# Patient Record
Sex: Female | Born: 1935 | Race: White | Hispanic: No | Smoking: Current every day smoker
Health system: Southern US, Community
[De-identification: ages and names within clinical notes are randomized; demographics above are authoritative.]

## PROBLEM LIST (undated history)

## (undated) DIAGNOSIS — D72829 Elevated white blood cell count, unspecified: Secondary | ICD-10-CM

## (undated) DIAGNOSIS — E785 Hyperlipidemia, unspecified: Secondary | ICD-10-CM

## (undated) DIAGNOSIS — J449 Chronic obstructive pulmonary disease, unspecified: Secondary | ICD-10-CM

## (undated) DIAGNOSIS — M1990 Unspecified osteoarthritis, unspecified site: Secondary | ICD-10-CM

## (undated) DIAGNOSIS — Z8679 Personal history of other diseases of the circulatory system: Secondary | ICD-10-CM

## (undated) DIAGNOSIS — Z720 Tobacco use: Secondary | ICD-10-CM

## (undated) HISTORY — DX: Chronic obstructive pulmonary disease, unspecified: J44.9

## (undated) HISTORY — DX: Tobacco use: Z72.0

## (undated) HISTORY — DX: Unspecified osteoarthritis, unspecified site: M19.90

## (undated) HISTORY — DX: Elevated white blood cell count, unspecified: D72.829

## (undated) HISTORY — PX: OTHER SURGICAL HISTORY: SHX169

## (undated) HISTORY — DX: Personal history of other diseases of the circulatory system: Z86.79

## (undated) HISTORY — DX: Hyperlipidemia, unspecified: E78.5

---

## 1998-04-15 ENCOUNTER — Other Ambulatory Visit: Admission: RE | Admit: 1998-04-15 | Discharge: 1998-04-15 | Payer: Self-pay | Admitting: Cardiology

## 1999-04-18 ENCOUNTER — Other Ambulatory Visit: Admission: RE | Admit: 1999-04-18 | Discharge: 1999-04-18 | Payer: Self-pay | Admitting: Cardiology

## 1999-12-08 ENCOUNTER — Other Ambulatory Visit: Admission: RE | Admit: 1999-12-08 | Discharge: 1999-12-08 | Payer: Self-pay | Admitting: Orthopedic Surgery

## 2000-06-16 ENCOUNTER — Other Ambulatory Visit: Admission: RE | Admit: 2000-06-16 | Discharge: 2000-06-16 | Payer: Self-pay | Admitting: Internal Medicine

## 2001-12-02 ENCOUNTER — Encounter: Admission: RE | Admit: 2001-12-02 | Discharge: 2001-12-02 | Payer: Self-pay | Admitting: Urology

## 2001-12-02 ENCOUNTER — Encounter: Payer: Self-pay | Admitting: Urology

## 2006-09-24 ENCOUNTER — Ambulatory Visit (HOSPITAL_COMMUNITY): Admission: RE | Admit: 2006-09-24 | Discharge: 2006-09-24 | Payer: Self-pay | Admitting: Internal Medicine

## 2006-11-15 ENCOUNTER — Inpatient Hospital Stay (HOSPITAL_COMMUNITY): Admission: EM | Admit: 2006-11-15 | Discharge: 2006-11-20 | Payer: Self-pay | Admitting: Emergency Medicine

## 2006-11-15 ENCOUNTER — Ambulatory Visit: Payer: Self-pay | Admitting: Cardiovascular Disease

## 2006-11-15 ENCOUNTER — Ambulatory Visit: Payer: Self-pay | Admitting: Internal Medicine

## 2006-11-17 ENCOUNTER — Encounter: Payer: Self-pay | Admitting: Cardiovascular Disease

## 2006-12-07 ENCOUNTER — Ambulatory Visit: Payer: Self-pay | Admitting: Internal Medicine

## 2007-01-19 ENCOUNTER — Ambulatory Visit: Payer: Self-pay | Admitting: Internal Medicine

## 2007-06-28 ENCOUNTER — Encounter (HOSPITAL_COMMUNITY): Admission: RE | Admit: 2007-06-28 | Discharge: 2007-09-26 | Payer: Self-pay | Admitting: Internal Medicine

## 2007-09-22 ENCOUNTER — Encounter: Payer: Self-pay | Admitting: Internal Medicine

## 2007-09-22 DIAGNOSIS — E785 Hyperlipidemia, unspecified: Secondary | ICD-10-CM | POA: Insufficient documentation

## 2007-09-22 DIAGNOSIS — J309 Allergic rhinitis, unspecified: Secondary | ICD-10-CM | POA: Insufficient documentation

## 2007-09-22 DIAGNOSIS — M1990 Unspecified osteoarthritis, unspecified site: Secondary | ICD-10-CM | POA: Insufficient documentation

## 2007-09-22 DIAGNOSIS — I2510 Atherosclerotic heart disease of native coronary artery without angina pectoris: Secondary | ICD-10-CM | POA: Insufficient documentation

## 2007-09-22 DIAGNOSIS — M810 Age-related osteoporosis without current pathological fracture: Secondary | ICD-10-CM | POA: Insufficient documentation

## 2007-09-22 DIAGNOSIS — J449 Chronic obstructive pulmonary disease, unspecified: Secondary | ICD-10-CM

## 2007-09-22 DIAGNOSIS — D72829 Elevated white blood cell count, unspecified: Secondary | ICD-10-CM | POA: Insufficient documentation

## 2007-09-29 ENCOUNTER — Ambulatory Visit: Payer: Self-pay | Admitting: Internal Medicine

## 2007-10-17 DIAGNOSIS — F17200 Nicotine dependence, unspecified, uncomplicated: Secondary | ICD-10-CM

## 2007-10-28 ENCOUNTER — Ambulatory Visit: Payer: Self-pay | Admitting: Internal Medicine

## 2007-10-28 ENCOUNTER — Encounter: Payer: Self-pay | Admitting: Internal Medicine

## 2008-12-09 ENCOUNTER — Encounter: Payer: Self-pay | Admitting: Internal Medicine

## 2009-09-16 ENCOUNTER — Encounter: Payer: Self-pay | Admitting: Internal Medicine

## 2009-10-14 ENCOUNTER — Encounter: Payer: Self-pay | Admitting: Internal Medicine

## 2009-10-31 ENCOUNTER — Encounter: Payer: Self-pay | Admitting: Internal Medicine

## 2009-11-25 ENCOUNTER — Encounter: Payer: Self-pay | Admitting: Internal Medicine

## 2009-11-27 ENCOUNTER — Encounter: Payer: Self-pay | Admitting: Internal Medicine

## 2009-11-28 ENCOUNTER — Telehealth: Payer: Self-pay | Admitting: Internal Medicine

## 2009-11-28 DIAGNOSIS — R634 Abnormal weight loss: Secondary | ICD-10-CM | POA: Insufficient documentation

## 2009-11-28 DIAGNOSIS — Z860100 Personal history of colon polyps, unspecified: Secondary | ICD-10-CM | POA: Insufficient documentation

## 2009-11-28 DIAGNOSIS — R110 Nausea: Secondary | ICD-10-CM

## 2009-12-20 ENCOUNTER — Telehealth: Payer: Self-pay | Admitting: Internal Medicine

## 2009-12-31 ENCOUNTER — Ambulatory Visit: Payer: Self-pay | Admitting: Internal Medicine

## 2010-01-01 ENCOUNTER — Telehealth: Payer: Self-pay | Admitting: Internal Medicine

## 2010-01-10 ENCOUNTER — Telehealth: Payer: Self-pay | Admitting: Internal Medicine

## 2010-01-24 ENCOUNTER — Ambulatory Visit: Payer: Self-pay | Admitting: Internal Medicine

## 2010-01-27 LAB — CONVERTED CEMR LAB
ALT: 21 units/L (ref 0–35)
AST: 26 units/L (ref 0–37)
Albumin: 3.9 g/dL (ref 3.5–5.2)
Alkaline Phosphatase: 60 units/L (ref 39–117)

## 2010-01-30 ENCOUNTER — Ambulatory Visit: Payer: Self-pay | Admitting: Internal Medicine

## 2010-01-31 ENCOUNTER — Ambulatory Visit (HOSPITAL_COMMUNITY): Admission: RE | Admit: 2010-01-31 | Discharge: 2010-01-31 | Payer: Self-pay | Admitting: Internal Medicine

## 2010-01-31 LAB — CONVERTED CEMR LAB: Prealbumin: 23.7 mg/dL (ref 18.0–45.0)

## 2010-02-04 ENCOUNTER — Ambulatory Visit: Payer: Self-pay | Admitting: Internal Medicine

## 2010-02-08 ENCOUNTER — Encounter: Payer: Self-pay | Admitting: Internal Medicine

## 2010-02-10 ENCOUNTER — Ambulatory Visit: Payer: Self-pay | Admitting: Internal Medicine

## 2010-02-10 ENCOUNTER — Encounter: Payer: Self-pay | Admitting: Internal Medicine

## 2010-02-10 DIAGNOSIS — J9610 Chronic respiratory failure, unspecified whether with hypoxia or hypercapnia: Secondary | ICD-10-CM

## 2010-05-05 ENCOUNTER — Ambulatory Visit: Payer: Self-pay | Admitting: Internal Medicine

## 2010-06-24 ENCOUNTER — Encounter: Payer: Self-pay | Admitting: Cardiovascular Disease

## 2010-07-23 ENCOUNTER — Ambulatory Visit: Payer: Self-pay | Admitting: Internal Medicine

## 2010-11-11 ENCOUNTER — Encounter: Payer: Self-pay | Admitting: Internal Medicine

## 2010-11-11 ENCOUNTER — Ambulatory Visit
Admission: RE | Admit: 2010-11-11 | Discharge: 2010-11-11 | Payer: Self-pay | Source: Home / Self Care | Attending: Internal Medicine | Admitting: Internal Medicine

## 2010-11-18 NOTE — Letter (Signed)
Summary: [facility]   Imported By: Lennie Odor 11/28/2009 17:39:24  _____________________________________________________________________  External Attachment:    Type:   Image     Comment:   External Document

## 2010-11-18 NOTE — Assessment & Plan Note (Signed)
Summary: Pulmonary consultation/ copd eval     Primary Provider/Referring Provider:  Dr. Waynard Edwards  CC:  Followup per Dr Waynard Edwards.  Pt last seen April 2008.  She states that her breathing has been worsening over the past several months.  She gets SOB with any exertion.  She also c/o dry cough.  She has lost her appetite.  Marland Kitchen  History of Present Illness: 89 yowf still smoking but 02 dep copd x FEV1 1.33 (72) with ratio of 48 and DLC0 54% No f/u in pulmonary clinic since 2008.  December 31, 2009 cc indolent onset 08/2009  progressive worsening  dry mouth and loss of appetite with 30 lb wt loss and now on 02 24 hour per day and only able room to room , mild dry congested cough.  Pt denies any significant sore throat, dysphagia, itching, sneezing,  nasal congestion or excess secretions,  fever, chills, sweats, pleuritic or exertional cp, hempoptysis, change in activity tolerance  orthopnea pnd or leg swelling.  Pt also denies any obvious fluctuation in symptoms with weather or environmental change or other alleviating or aggravating factors except better transiently some  better with prednisone.      Current Medications (verified): 1)  Crestor 10 Mg  Tabs (Rosuvastatin Calcium) .... Once Daily 2)  Advair Diskus 250-50 Mcg/dose  Misc (Fluticasone-Salmeterol) .... Two Times A Day 3)  Adult Aspirin Low Strength 81 Mg  Tbdp (Aspirin) .... Once Daily 4)  Vitamin D 2000 Iu .... Once Daily 5)  Calcium With Vit D 600mg  .... Two Times A Day 6)  Flonase 50 Mcg/act  Susp (Fluticasone Propionate) .... As Needed 7)  Spiriva Handihaler 18 Mcg Caps (Tiotropium Bromide Monohydrate) .... Inhale Contents of 1 Capsule Daily 8)  Lexapro (? Strength) .... 1/2 Once Daily 9)  Proair Hfa 108 (90 Base) Mcg/act Aers (Albuterol Sulfate) .... 2 Puffs Every 4-6 Hours As Needed  Allergies (verified): No Known Drug Allergies  Past History:  Past Medical History: TOBACCO ABUSE (ICD-305.1) ATHEROSCLEROTIC CARDIOVASCULAR DISEASE  (ICD-429.2) LEUKOCYTOSIS (ICD-288.60) OSTEOARTHRITIS (ICD-715.90) ALLERGIC RHINITIS (ICD-477.9) HYPERLIPIDEMIA (ICD-272.4) OSTEOPOROSIS (ICD-733.00) COPD (ICD-496)      - PFT's 01/19/07  FEV1 1.33 (72) with ratio of 48 and DLC0 54%      - HFA 50% December 31, 2009   Review of Systems  The patient denies shortness of breath at rest, productive cough, coughing up blood, chest pain, irregular heartbeats, acid heartburn, indigestion, abdominal pain, difficulty swallowing, sore throat, tooth/dental problems, headaches, nasal congestion/difficulty breathing through nose, sneezing, itching, ear ache, anxiety, depression, hand/feet swelling, joint stiffness or pain, rash, change in color of mucus, and fever.    Vital Signs:  Patient profile:   77 year old female Height:      62 inches Weight:      96.25 pounds BMI:     17.67 O2 Sat:      97 % on 4 L/min Temp:     98.3 degrees F oral Pulse rate:   123 / minute  Vitals Entered By: Vernie Murders (December 31, 2009 2:30 PM)  O2 Flow:  4 L/min  Physical Exam  Additional Exam:  wt 127 > 96 December 31, 2009  HEENT mild turbinate edema.  Oropharynx no thrush or excess pnd or cobblestoning.  No JVD or cervical adenopathy. Mild accessory muscle hypertrophy. Trachea midline, nl thryroid. Chest was hyperinflated by percussion with diminished breath sounds and marked increased exp time without wheeze. Hoover sign positive onset of inspiration. Regular rate and rhythm without  murmur gallop or rub or increase P2.  No edema.  Decrease s1s2 Abd: no hsm, nl excursion. Ext warm without cyanosis or clubbing.    Impression & Recommendations:  Problem # 1:  COPD (ICD-496) Previously only GOLD II so unlikely she's progressed that much over the past 3 years and steroid response encouraging if only in the short run.    DDX of  difficult airways managment all start with A and  include Adherence, Ace Inhibitors, Acid Reflux, Active Sinus Disease, Alpha 1 Antitripsin  deficiency, Anxiety masquerading as Airways dz,  ABPA,  allergy(esp in young), Aspiration (esp in elderly), Adverse effects of DPI,  Active smokers, plus one B  = Beta blocker use..   Adherence and Adverse effect of DPI as well as Active smoking all major concerns here.  For now will try change to duoneb until returns for PFT's.   Each maintenance medication was reviewed in detail including most importantly the difference between maintenance and as needed and under what circumstances the prns are to be used. See instructions for specific recommendations   Problem # 2:  TOBACCO ABUSE (ICD-305.1)  Discussed but not ready to committ to quit at this point - emphasized risks involved in continuing smoking and that patient should consider these in the context of the cost of smoking relative to the benefit obtained.   I took this opportunity to educate the patient regarding the consequences of smoking in airway disorders based on all the data we have from the multiple national lung health studies indicating that smoking cessation, not choice of inhalers or physicians, is the most important aspect of care.    Medications Added to Medication List This Visit: 1)  Spiriva Handihaler 18 Mcg Caps (Tiotropium bromide monohydrate) .... Inhale contents of 1 capsule daily 2)  Lexapro (? Strength)  .... 1/2 once daily 3)  Proair Hfa 108 (90 Base) Mcg/act Aers (Albuterol sulfate) .... 2 puffs every 4-6 hours as needed 4)  Prednisone 10 Mg Tabs (Prednisone) .... 4 each am x 2days, 2x2days, 1x2days and stop 5)  Duoneb 0.5-2.5 (3) Mg/64ml Soln (Ipratropium-albuterol) .... One four times a day  Other Orders: New Patient Level V ([address])  Patient Instructions: 1)  Prednisone 4 each am x 2days, 2x2days, 1x2days and stop  2)  Duoneb up to 4 x times a day and use proaire as needed  3)  Stop spiriva and advair 4)  Prilosec 20mg  Take  one 30-60 min before first meal of the day and pepcid 20 mg one at bedtime  5)  GERD  (REFLUX)  is a common cause of respiratory symptoms. It commonly presents without heartburn and can be treated with medication, but also with lifestyle changes including avoidance of late meals, excessive alcohol, smoking cessation, and avoid fatty foods, chocolate, peppermint, colas, red wine, and acidic juices such as orange juice. NO MINT OR MENTHOL PRODUCTS SO NO COUGH DROPS  6)  USE SUGARLESS CANDY INSTEAD (jolley ranchers)  7)  NO OIL BASED VITAMINS  8)  Please schedule a follow-up appointment in 6 weeks, sooner if needed with PFT's on return Prescriptions: DUONEB 0.5-2.5 (3) MG/3ML SOLN (IPRATROPIUM-ALBUTEROL) one four times a day  #120 x 11   Entered and Authorized by:   Nyoka Cowden MD   Signed by:   Nyoka Cowden MD on 12/31/2009   Method used:   Electronically to        [facility] (retail)       [phone]  [address]       [place], [place]  [address]       Ph: [phone]       Fax: [phone]   RxID:   [id number] PREDNISONE 10 MG  TABS (PREDNISONE) 4 each am x 2days, 2x2days, 1x2days and stop  #14 x 0   Entered and Authorized by:   Nyoka Cowden MD   Signed by:   Nyoka Cowden MD on 12/31/2009   Method used:   Electronically to        [facility] ([facility])       [address]       [place], [place]  [address]       Ph: [phone]       Fax: [phone]   RxID:   [id number]

## 2010-11-18 NOTE — Assessment & Plan Note (Signed)
Summary: Pulmonary/ f/u ov teach hfa 90% > try symbicort 160   Copy to:  n/a Primary Provider/Referring Provider:  Redge Gainer. Perini, MD   CC:  3 month followup.  Pt states that her breathing is the same- no better or worse.  She c/o nasal congestion and "tickle in throat" x 2 months.  She has occ cough with clear sputum.Marland Kitchen  History of Present Illness: 77  yowf still smoking but noct 02 dep copd with FEV1 .95 ( 54%) ratio 41 February 10, 2010   December 31, 2009 cc indolent onset 08/2009  progressive worsening  dry mouth and loss of appetite with 30 lb wt loss and now on 02 24 hour per day and only able room to room , mild dry congested cough.  rec Prednisone 4 each am x 2days, 2x2days, 1x2days and stop  Duoneb up to 4 x times a day and use proaire as needed  Stop spiriva and advair Prilosec 20mg  Take  one 30-60 min before first meal of the day and pepcid 20 mg one at bedtime   February 10, 2010 6 wk followup with PFT' c/w GOLD II copd.  Pt states that her breathing has improved a little since last seen.  She is also coughing less and now cough is prod with clear sputum.   Not taking prilosec before meals but has found doesn't need the neb but twice daily at this point. rec stop smoking, no change rx  May 05, 2010 3 month followup- breathing is the same- no better or worse.  No new complaints today.  Still smoking. no increase need for rescue, no change doe. rec stop smoking continue ppi ac daily and h2 hs.  July 23, 2010 3 month followup.  Pt states that her breathing is the same- no better or worse.  She c/o nasal congestion and "tickle in throat" x 2 months.  She has occ cough with clear sputum. Pt denies any significant sore throat, dysphagia, itching, sneezing,  nasal congestion or excess secretions,  fever, chills, sweats, unintended wt loss, pleuritic or exertional cp, hempoptysis, change in activity tolerance  orthopnea pnd or leg swelling.  Pt also denies any obvious fluctuation in symptoms with  weather or environmental change or other alleviating or aggravating factors.         Preventive Screening-Counseling & Management  Alcohol-Tobacco     Smoking Status: current     Smoking Cessation Counseling: yes  Current Medications (verified): 1)  Crestor 10 Mg  Tabs (Rosuvastatin Calcium) .... Once Daily 2)  Adult Aspirin Low Strength 81 Mg  Tbdp (Aspirin) .... Once Daily 3)  Vitamin D 1000 Unit Tabs (Cholecalciferol) .Marland Kitchen.. 1 Once Daily 4)  Calcium With Vit D 600mg  .... Two Times A Day 5)  Flonase 50 Mcg/act  Susp (Fluticasone Propionate) .... 2 Spray Each Nostril Once Daily 6)  Lexapro 10 Mg Tabs (Escitalopram Oxalate) .... 1/2 Tablet Daily 7)  Align  Caps (Probiotic Product) .... One Capsule By Mouth Once Daily 8)  Omeprazole 20 Mg Cpdr (Omeprazole) .... One Tablet By Mouth Once Daily 9)  Saline Nasal Spray 0.65 % Soln (Saline) .Marland Kitchen.. 1 To 2 Sprays Once Daily As Needed 10)  Duoneb 0.5-2.5 (3) Mg/69ml Soln (Ipratropium-Albuterol) .... One in Nebulizer Up To 4 Times Per Day As Needed 11)  Proair Hfa 108 (90 Base) Mcg/act Aers (Albuterol Sulfate) .... 2 Puffs Every 4-6 Hours As Needed 12)  * 02 2lpm At Bedtime and As Needed Otherwise  Allergies (  verified): No Known Drug Allergies  Past History:  Past Medical History: TOBACCO ABUSE (ICD-305.1) ATHEROSCLEROTIC CARDIOVASCULAR DISEASE (ICD-429.2) LEUKOCYTOSIS (ICD-288.60) OSTEOARTHRITIS (ICD-715.90) ALLERGIC RHINITIS (ICD-477.9) HYPERLIPIDEMIA (ICD-272.4) OSTEOPOROSIS (ICD-733.00) COPD (ICD-496)      - PFT's 01/19/07  FEV1 1.33 (72) with ratio of 48 and DLC0 54%      - PFT/s 2/25/11FEV1 .95(75) ratio 41with DLC0 53%      - HFA 50% December 31, 2009 > 75% February 10, 2010 > 75% May 05, 2010 > 90% July 23, 2010   Vital Signs:  Patient profile:   77 year old female Weight:      101 pounds O2 Sat:      93 % on Room air Temp:     97.8 degrees F oral Pulse rate:   79 / minute BP sitting:   148 / 82  (left arm)  Vitals Entered By:  Vernie Murders (July 23, 2010 3:18 PM)  O2 Flow:  Room air  Physical Exam  Additional Exam:  wt  96 December 31, 2009 > 95 February 10, 2010 > 98 May 05, 2010 > 101 July 23, 2010  HEENT mild turbinate edema.  Oropharynx no thrush or excess pnd or cobblestoning.  No JVD or cervical adenopathy. Mild accessory muscle hypertrophy. Trachea midline, nl thryroid. Chest was hyperinflated by percussion with diminished breath sounds and marked increased exp time without wheeze. Hoover sign positive onset of inspiration. Regular rate and rhythm without murmur gallop or rub or increase P2.  No edema.  Decrease s1s2 Abd: no hsm, nl excursion. Ext warm without cyanosis or clubbing.    Impression & Recommendations:  Problem # 1:  COPD (ICD-496) GOLD II/II  DDX of  difficult airways managment all start with A and  include Adherence, Ace Inhibitors, Acid Reflux, Active Sinus Disease, Alpha 1 Antitripsin deficiency, Anxiety masquerading as Airways dz,  ABPA,  allergy(esp in young), Aspiration (esp in elderly), Adverse effects of DPI,  Active smokers, plus one B  = Beta blocker use.Marland Kitchen   Active smoking biggest issue, see #2  Adherence:  I spent extra time with the patient today explaining optimal mdi  technique.  This improved from  75 to 90% with coaching so try symbicort 160 2 puffs first thing  in am and 2 puffs again in pm about 12 hours later and see if this improves breathing and reduces need for proaire  Problem # 2:  TOBACCO ABUSE (ICD-305.1)   I emphasized that although we never turn away smokers from the pulmonary clinic, we do ask that they understand that the recommendations that were made won't work nearly as well in the presence of continued cigarette exposure and we may reach a point where we can't help the patient if he/she can't quit smoking.    Orders: Est. Patient Level IV ([address])  Medications Added to Medication List This Visit: 1)  Vitamin D 1000 Unit Tabs (Cholecalciferol) .Marland Kitchen.. 1 once  daily 2)  Flonase 50 Mcg/act Susp (Fluticasone propionate) .... 2 spray each nostril once daily 3)  Flonase 50 Mcg/act Susp (Fluticasone propionate) .Marland Kitchen.. 1-2 sprays every 12 hours 4)  Omeprazole 20 Mg Cpdr (Omeprazole) .... Take  one 30-60 min before first meal of the day 5)  Pepcid 20 Mg Tabs (Famotidine) .... Take one by mouth at bedtime 6)  Symbicort 160-4.5 Mcg/act Aero (Budesonide-formoterol fumarate) .... 2 puffs first thing  in am and 2 puffs again in pm about 12 hours later  Patient Instructions: 1)  Flonase  no immediate benefit in terms of improving symptoms.  To help them reached the target tissue, the patient should use Afrin two puffs every 12 hours applied one min before using the nasal steroids.  Afrin should be stopped after no more than 5 days.  If the symptoms worsen, Afrin can be restarted after 5 days off of therapy to prevent rebound congestion from overuse of Afrin.  I also emphasized that in no way are nasal steroids a concern in terms of "addiction". 2)  Trial symbicort 160 2 puffs first thing  in am and 2 puffs again in pm about 12 hours later and it improves your breathing and reduces your need for proaiare - symbicort works in 15 mins so after this time ok to supplement the symbicort with duoneb or the proaire 3)  Return to office in 3 months, sooner if needed  Prescriptions: SYMBICORT 160-4.5 MCG/ACT  AERO (BUDESONIDE-FORMOTEROL FUMARATE) 2 puffs first thing  in am and 2 puffs again in pm about 12 hours later  #1 x 11   Entered and Authorized by:   Nyoka Cowden MD   Signed by:   Nyoka Cowden MD on 07/23/2010   Method used:   Print then Give to Patient   RxID:   [id number]

## 2010-11-18 NOTE — Assessment & Plan Note (Signed)
Summary: weight loss/abdmonial pain--ch.    History of Present Illness Visit Type: Follow-up Visit Primary GI MD: Lina Sar MD Primary Provider: Redge Gainer. Perini, MD  Requesting Provider: n/a Chief Complaint: weight loss and loss of appetite  History of Present Illness:   77 year old white female with  severe O2 dependent  COPD who continues to smoke. She has  weight loss of 30 pounds,  from 125 pounds 2 years ago to currently 92 pounds. She complains of dry mouth, decreased appetite. and indigestion. She is on  24-hour a day 2l  nasal oxygen.. Patient denies nausea, vomiting or diarrhea. Her bowel habits have been regular. Last colonoscopy in January 2009 showed  2 hyperplastic polyps. She has  tried  Megace but stopped taking it because she didn't feel it helped her to gain weight.   GI Review of Systems    Reports loss of appetite and  weight loss.   Weight loss of 30 pounds over 8 months .   Denies abdominal pain, acid reflux, belching, bloating, chest pain, dysphagia with liquids, dysphagia with solids, heartburn, nausea, vomiting, vomiting blood, and  weight gain.        Denies anal fissure, black tarry stools, change in bowel habit, constipation, diarrhea, diverticulosis, fecal incontinence, heme positive stool, hemorrhoids, irritable bowel syndrome, jaundice, light color stool, liver problems, rectal bleeding, and  rectal pain.    Current Medications (verified): 1)  Crestor 10 Mg  Tabs (Rosuvastatin Calcium) .... Once Daily 2)  Adult Aspirin Low Strength 81 Mg  Tbdp (Aspirin) .... Once Daily 3)  Vitamin D 2000 Iu .... Once Daily 4)  Calcium With Vit D 600mg  .... Two Times A Day 5)  Flonase 50 Mcg/act  Susp (Fluticasone Propionate) .... As Needed 6)  Proair Hfa 108 (90 Base) Mcg/act Aers (Albuterol Sulfate) .... 2 Puffs Every 4-6 Hours As Needed 7)  Duoneb 0.5-2.5 (3) Mg/28ml Soln (Ipratropium-Albuterol) .... One Four Times A Day 8)  Lexapro 10 Mg Tabs (Escitalopram Oxalate) ....  1/2 Tablet Daily 9)  Align  Caps (Probiotic Product) .... One Capsule By Mouth Once Daily 10)  Pepcid Ac Maximum Strength 20 Mg Tabs (Famotidine) .... One Tablet By Mouth At Bedtime  Allergies (verified): No Known Drug Allergies  Past History:  Past Medical History: Reviewed history from 12/31/2009 and no changes required. TOBACCO ABUSE (ICD-305.1) ATHEROSCLEROTIC CARDIOVASCULAR DISEASE (ICD-429.2) LEUKOCYTOSIS (ICD-288.60) OSTEOARTHRITIS (ICD-715.90) ALLERGIC RHINITIS (ICD-477.9) HYPERLIPIDEMIA (ICD-272.4) OSTEOPOROSIS (ICD-733.00) COPD (ICD-496)      - PFT's 01/19/07  FEV1 1.33 (72) with ratio of 48 and DLC0 54%      - HFA 50% December 31, 2009   Past Surgical History: Reviewed history from 09/22/2007 and no changes required. Angiogram  Family History: Reviewed history from 11/28/2009 and no changes required. Family History of Heart Disease: Father No FH of Colon Cancer:  Social History: Occupation: Retired Patient currently smokes.  Alcohol Use - yes: 1/2- 2 glasses of wine daily  Daily Caffeine Use: one cup of tea or coffee  Illicit Drug Use - no Patient does not get regular exercise.  Does Patient Exercise:  no  Review of Systems       The patient complains of fatigue.  The patient denies allergy/sinus, anemia, anxiety-new, arthritis/joint pain, back pain, blood in urine, breast changes/lumps, change in vision, confusion, cough, coughing up blood, depression-new, fainting, fever, headaches-new, hearing problems, heart murmur, heart rhythm changes, itching, menstrual pain, muscle pains/cramps, night sweats, nosebleeds, pregnancy symptoms, shortness of breath, skin rash, sleeping problems,  sore throat, swelling of feet/legs, swollen lymph glands, thirst - excessive , urination - excessive , urination changes/pain, urine leakage, vision changes, and voice change.         Pertinent positive and negative review of systems were noted in the above HPI. All other ROS was otherwise  negative.   Vital Signs:  Patient profile:   77 year old female Height:      62 inches Weight:      92 pounds BMI:     16.89 BSA:     1.38 Pulse rate:   100 / minute Pulse rhythm:   regular BP sitting:   136 / 84  (left arm) Cuff size:   regular  Vitals Entered By: Ok Anis CMA (January 24, 2010 3:04 PM)  Physical Exam  General:  Gen. chronically ill appearing female alert and oriented with the nasal oxygen cannula. She's able to ambulate independently Eyes:  PERRLA, no icterus. Mouth:  normal oral mucosa Chest Wall:  intercostal wasting Lungs:  decreased breath sounds Heart:  rapid S1-S2 Abdomen:  scaphoid soft abdomen with normoactive bowel sounds. No distention. No tenderness no bruit, no palpable mass or stool, Rectal:  soft Hemoccult negative stool Extremities:  No clubbing, cyanosis, edema or deformities noted. Psych:  Alert and cooperative. Normal mood and affect.   Impression & Recommendations:  Problem # 1:  WEIGHT LOSS, ABNORMAL (ICD-783.21) abnormal weight loss in patient with severe COPD and decreased appetite continued smoking. She turns off her oxygen when she smokes.R/o  occult malignancy, rule out peptic ulcer disease. We will schedule upper abdominal ultrasound to visualize the pancreas and upper endoscopy to rule out peptic ulcer disease or gastric outlet obstruction. She will restart  Prilosec 20 mg daily. We will check serum  prealbumin to assess her nutritional status. She will restart Megace 200 mg daily. We have discussed possibly  low dose  the prednisone to  stimulate her appetite. She has started on Lexapro 10 mg daily just 2 weeks ago and it may help. I feel she most likely has a COPD related cachexia. Also urged her to stop smoking Orders: TLB-Albumin (82040-ALB) TLB-Hepatic/Liver Function Pnl (80076-HEPATIC) EGD (EGD) Ultrasound Abdomen (UAS)  Problem # 2:  COLONIC POLYPS, HYPERPLASTIC, HX OF (ICD-V12.72) last colonoscopy January 2009 showed  hyperplastic polyp. Recall colonoscopy 7 years if she is in a condition to have it.  Problem # 3:  TOBACCO ABUSE (ICD-305.1) continues to smoke, takes her nasal O2 off when she smokes., example of poor discipline.  Patient Instructions: 1)  You have been scheduled for a abdmominal ultrasound. 2)  Upper Endoscopy brochure given.  3)  Prilosec 20 mg p.o. q.a.m. 4)  Megace 5 cc p.o. q.d. 5)  Consider low-dose steroids to improve appetite 6)  Serum prealbumin 7)  Copy sent to :DR Rodrigo Ran  Prescriptions: OMEPRAZOLE 20 MG CPDR (OMEPRAZOLE) one tablet by mouth once daily  #30 x 11   Entered by:   Christie Nottingham CMA (AAMA)   Authorized by:   Hart Carwin MD   Signed by:   Hart Carwin MD on 01/25/2010   Method used:   Electronically to        [facility] (retail)       [address]       [place], [place]  [address]       Ph: [phone]       Fax: [phone]  RxID:   [id number] MEGACE ORAL 40 MG/ML SUSP (MEGESTROL ACETATE) one teaspoon (10 oz)  once daily  #220mg  x 2   Entered by:   Christie Nottingham CMA (AAMA)   Authorized by:   Hart Carwin MD   Signed by:   Hart Carwin MD on 01/25/2010   Method used:   Electronically to        [facility] (retail)       [address]       [place], [place]  [address]       Ph: [phone]       Fax: [phone]   RxID:   [id number]

## 2010-11-18 NOTE — Letter (Signed)
Summary: EGD Instructions  [facility]  [address] [place], [place] [address]   Phone: [phone]  Fax: [phone]       NGOC DETJEN    September 23, 1931    MRN: [id number]       Procedure Day /Date: Tuesday April 19th, 2011     Arrival Time:  3:00pm     Procedure Time: 4:00pm     Location of Procedure:                    _  x_ [facility] (4th Floor)    PREPARATION FOR ENDOSCOPY   On  02/04/10 THE DAY OF THE PROCEDURE:  1.   No solid foods, milk or milk products are allowed after midnight the night before your procedure.  2.   Do not drink anything colored red or purple.  Avoid juices with pulp.  No orange juice.  3.  You may drink clear liquids until 2:00pm , which is 2 hours before your procedure.                                                                                                CLEAR LIQUIDS INCLUDE: Water Jello Ice Popsicles Tea (sugar ok, no milk/cream) Powdered fruit flavored drinks Coffee (sugar ok, no milk/cream) Gatorade Juice: apple, white grape, white cranberry  Lemonade Clear bullion, consomm, broth Carbonated beverages (any kind) Strained chicken noodle soup Hard Candy   MEDICATION INSTRUCTIONS  Unless otherwise instructed, you should take regular prescription medications with a small sip of water as early as possible the morning of your procedure.          OTHER INSTRUCTIONS  You will need a responsible adult at least 76 years of age to accompany you and drive you home.   This person must remain in the waiting room during your procedure.  Wear loose fitting clothing that is easily removed.  Leave jewelry and other valuables at home.  However, you may wish to bring a book to read or an iPod/MP3 player to listen to music as you wait for your procedure to start.  Remove all body piercing jewelry and leave at home.  Total time from sign-in until discharge is approximately 2-3 hours.  You should go  home directly after your procedure and rest.  You can resume normal activities the day after your procedure.  The day of your procedure you should not:   Drive   Make legal decisions   Operate machinery   Drink alcohol   Return to work  You will receive specific instructions about eating, activities and medications before you leave.    The above instructions have been reviewed and explained to me by   Marchelle Folks.     I fully understand and can verbalize these instructions _____________________________ Date _________

## 2010-11-18 NOTE — Letter (Signed)
Summary: [facility]  [facility]   Imported By: Lennie Odor 11/28/2009 17:37:40  _____________________________________________________________________  External Attachment:    Type:   Image     Comment:   External Document

## 2010-11-18 NOTE — Letter (Signed)
Summary: Patient Notice-Endo Biopsy Results  [facility]  [address] [place], [place] [address]   Phone: [phone]  Fax: [phone]        February 08, 2010 MRN: [id number]    DERITA MICHELSEN [address] [place], [place]  [address]    Dear Ms. Laural Benes,  I am pleased to inform you that the biopsies taken during your recent endoscopic examination did not show any evidence of cancer upon pathologic examination.  Additional information/recommendations:  __No further action is needed at this time.  Please follow-up with      your primary care physician for your other healthcare needs.  __ Please call [phone] to schedule a return visit to review      your condition.  _x_ Continue with the treatment plan as outlined on the day of your      exam.     Please call us if you are having persistent problems or have questions about your condition that have not been fully answered at this time.  Sincerely,  Hart Carwin MD  This letter has been electronically signed by your physician.  Appended Document: Patient Notice-Endo Biopsy Results letter mailed 4.25.11

## 2010-11-18 NOTE — Progress Notes (Signed)
Summary: fyi  Phone Note Call from Patient   Caller: Patient Call For: Lerone Onder Summary of Call: pt needed to give meds that she forgot to let dr know she was taking which is: aprolextro 5mg  once daily and metestrol ac Initial call taken by: Rickard Patience,  January 01, 2010 10:30 AM  Follow-up for Phone Call        spoke with pt and she states at OV she could not remember strength of lexapro. Sh eis taking lexapro 10mg  1/2 tab daily. I have changed this on the med list. The pt wasnot sure again what the second med was, so I advisd when she figured it out to call with name. Carron Curie CMA  January 01, 2010 11:10 AM     New/Updated Medications: LEXAPRO 10 MG TABS (ESCITALOPRAM OXALATE) 1/2 tablet daily

## 2010-11-18 NOTE — Assessment & Plan Note (Signed)
Summary: Pulmonary/ ext f/u ov with hfa at 50%   Copy to:  n/a Primary Provider/Referring Provider:  Redge Gainer. Perini, MD   CC:  3 month followup- breathing is the same- no better or worse.  No new complaints today.  Still smoking.Marland Kitchen  History of Present Illness: 38  yowf still smoking but noct 02 dep copd with FEV1 .95 ( 54%) ratio 41 February 10, 2010   December 31, 2009 cc indolent onset 08/2009  progressive worsening  dry mouth and loss of appetite with 30 lb wt loss and now on 02 24 hour per day and only able room to room , mild dry congested cough.  rec Prednisone 4 each am x 2days, 2x2days, 1x2days and stop  Duoneb up to 4 x times a day and use proaire as needed  Stop spiriva and advair Prilosec 20mg  Take  one 30-60 min before first meal of the day and pepcid 20 mg one at bedtime   February 10, 2010 6 wk followup with PFT's.  Pt states that her breathing has improved a little since last seen.  She is also coughing less and now cough is prod with clear sputum.   Not taking prilosec before meals but has found doesn't need the neb but twice daily at this point. rec stop smoking, no change rx  May 05, 2010 3 month followup- breathing is the same- no better or worse.  No new complaints today.  Still smoking. no increase need for rescue, no change doe. Pt denies any significant sore throat, dysphagia, itching, sneezing,  nasal congestion or excess secretions,  fever, chills, sweats, unintended wt loss, pleuritic or exertional cp, hempoptysis, change in activity tolerance  orthopnea pnd or leg swelling.  Pt also denies any obvious fluctuation in symptoms with weather or environmental change or other alleviating or aggravating factors.    Pt denies any increase in rescue therapy over baseline, denies waking up needing it or having early am exacerbations of coughing/wheezing/ or dyspnea      Current Medications (verified): 1)  Crestor 10 Mg  Tabs (Rosuvastatin Calcium) .... Once Daily 2)  Adult Aspirin  Low Strength 81 Mg  Tbdp (Aspirin) .... Once Daily 3)  Vitamin D 2000 Iu .... Once Daily 4)  Calcium With Vit D 600mg  .... Two Times A Day 5)  Flonase 50 Mcg/act  Susp (Fluticasone Propionate) .... As Needed 6)  Lexapro 10 Mg Tabs (Escitalopram Oxalate) .... 1/2 Tablet Daily 7)  Align  Caps (Probiotic Product) .... One Capsule By Mouth Once Daily 8)  Omeprazole 20 Mg Cpdr (Omeprazole) .... One Tablet By Mouth Once Daily 9)  Saline Nasal Spray 0.65 % Soln (Saline) .Marland Kitchen.. 1 To 2 Sprays Once Daily As Needed 10)  Duoneb 0.5-2.5 (3) Mg/97ml Soln (Ipratropium-Albuterol) .... One in Nebulizer Up To 4 Times Per Day As Needed 11)  Proair Hfa 108 (90 Base) Mcg/act Aers (Albuterol Sulfate) .... 2 Puffs Every 4-6 Hours As Needed 12)  * 02 2lpm At Bedtime and As Needed Otherwise  Allergies (verified): No Known Drug Allergies  Past History:  Past Medical History: TOBACCO ABUSE (ICD-305.1) ATHEROSCLEROTIC CARDIOVASCULAR DISEASE (ICD-429.2) LEUKOCYTOSIS (ICD-288.60) OSTEOARTHRITIS (ICD-715.90) ALLERGIC RHINITIS (ICD-477.9) HYPERLIPIDEMIA (ICD-272.4) OSTEOPOROSIS (ICD-733.00) COPD (ICD-496)      - PFT's 01/19/07  FEV1 1.33 (72) with ratio of 48 and DLC0 54%      - PFT/s 2/25/11FEV1 .95(54) ratio 41with DLC0 53%      - HFA 50% December 31, 2009 > 75% February 10, 2010 >  75% May 05, 2010   Vital Signs:  Patient profile:   77 year old female Weight:      98.4 pounds O2 Sat:      90 % on Room air Temp:     97.9 degrees F oral Pulse rate:   77 / minute BP sitting:   118 / 74  (left arm)  Vitals Entered By: Vernie Murders (May 05, 2010 4:34 PM)  O2 Flow:  Room air  Physical Exam  Additional Exam:  wt  96 December 31, 2009 > 95 February 10, 2010 > 98 May 05, 2010  HEENT mild turbinate edema.  Oropharynx no thrush or excess pnd or cobblestoning.  No JVD or cervical adenopathy. Mild accessory muscle hypertrophy. Trachea midline, nl thryroid. Chest was hyperinflated by percussion with diminished breath sounds  and marked increased exp time without wheeze. Hoover sign positive onset of inspiration. Regular rate and rhythm without murmur gallop or rub or increase P2.  No edema.  Decrease s1s2 Abd: no hsm, nl excursion. Ext warm without cyanosis or clubbing.    Impression & Recommendations:  Problem # 1:  COPD (ICD-496)  GOLD II/III     DDX of  difficult airways managment all start with A and  include Adherence, Ace Inhibitors, Acid Reflux, Active Sinus Disease, Alpha 1 Antitripsin deficiency, Anxiety masquerading as Airways dz,  ABPA,  allergy(esp in young), Aspiration (esp in elderly), Adverse effects of DPI,  Active smokers, plus one B  = Beta blocker use.Marland Kitchen   Active smoking biggest issue, see #2  Adherence:  I spent extra time with the patient today explaining optimal mdi  technique.  This improved from  25-50% but no better  Problem # 2:  TOBACCO ABUSE (ICD-305.1)   I emphasized that although we never turn away smokers from the pulmonary clinic, we do ask that they understand that the recommendations that were made won't work nearly as well in the presence of continued cigarette exposure and we may reach a point where we can't help the patient if he/she can't quit smoking.     I took this opportunity to emphasize  the consequences of smoking in airway disorders based on all the data we have from the multiple national lung health studies indicating that smoking cessation, not choice of inhalers or physicians, is the most important aspect of care.    Orders: Tobacco use cessation intermediate 3-10 minutes (35573)  Problem # 3:  RESPIRATORY FAILURE, CHRONIC (ICD-518.83)  ok resting sats. See instructions for specific recommendations re 02 rx  Orders: Est. Patient Level IV (22025)  Patient Instructions: 1)  Prilosec 20 Take  one 30-60 min before first meal of the day and continue pepcid 20 mg one at bedtime.  2)  Stop smoking before it stops you 3)  Return to office in 3 months, sooner if  needed 4)  Ok to use 02 automatically at bedtime and as needed during the day if short of breath with activity 5)  Work on inhaler technique:  relax and blow all the way out then take a nice smooth deep breath back in, triggering the inhaler at same time you start breathing in and hold a few seconds

## 2010-11-18 NOTE — Letter (Signed)
Summary: [facility]  [facility]   Imported By: Lennie Odor 11/28/2009 17:40:05  _____________________________________________________________________  External Attachment:    Type:   Image     Comment:   External Document

## 2010-11-18 NOTE — Miscellaneous (Signed)
Summary: Orders Update pft charges  Clinical Lists Changes  Orders: Added new Service order of Carbon Monoxide diffusing w/capacity (94720) - Signed Added new Service order of Lung Volumes (94240) - Signed Added new Service order of Spirometry (Pre & Post) (94060) - Signed 

## 2010-11-18 NOTE — Progress Notes (Signed)
Summary: Triage-Wt.Loss   Phone Note From Other Clinic   Caller: [facility]  [phone] Call For: Dr. Juanda Chance Summary of Call: Pt needs to be seen ASAP for excessive weight loss. Initial call taken by: Karna Christmas,  December 20, 2009 2:51 PM  Follow-up for Phone Call        Pt. was a 'No Show' for acute work-in with Dr.Mourad Cwikla on 11-29-09. Malachi Bonds is aware and will instruct pt. she must keep appt. or at least call to r/s. Pt. will see Amy Esterwood PAC on 12-25-09 at 11am. Malachi Bonds will advise pt. of appt/med.list/co-pay/cx.policy. Malachi Bonds will fax all records to [facility] at [phone]. Follow-up by: Laureen Ochs LPN,  December 20, 2009 3:07 PM

## 2010-11-18 NOTE — Procedures (Signed)
Summary: Spirometry/[facility]  Spirometry/[facility]   Imported By: Sherian Rein 02/14/2010 13:37:38  _____________________________________________________________________  External Attachment:    Type:   Image     Comment:   External Document

## 2010-11-18 NOTE — Procedures (Signed)
Summary: Upper Endoscopy  Patient: [name] Note: All result statuses are Final unless otherwise noted.  Tests: (1) Upper Endoscopy (EGD)   EGD Upper Endoscopy       DONE     [facility]     [address]     [place], [place]  [address]           ENDOSCOPY PROCEDURE REPORT           PATIENT:  Janice Tucker, Janice Tucker  MR#:  [id number]     BIRTHDATE:  1931/04/14, 78 yrs. old  GENDER:  female           ENDOSCOPIST:  Hedwig Morton. Juanda Chance, MD     Referred by:  Rodrigo Ran, M.D.           PROCEDURE DATE:  02/04/2010     PROCEDURE:  EGD with biopsy     ASA CLASS:  Class III     INDICATIONS:  weight loss advanced O2 dependent COPD, continues to     smoke     30 lb weight loss, decreased apetite, denies abd. pain           MEDICATIONS:   Versed 2 mg, Fentanyl 25 mcg     TOPICAL ANESTHETIC:  Exactacain Spray           DESCRIPTION OF PROCEDURE:   After the risks benefits and     alternatives of the procedure were thoroughly explained, informed     consent was obtained.  The [facility] GIF-[id number] [id number] endoscope was     introduced through the mouth and advanced to the second portion of     the duodenum, without limitations.  The instrument was slowly     withdrawn as the mucosa was fully examined.     <<PROCEDUREIMAGES>>           Mild gastritis was found (see image6, image4, and image8). old     blood specks in the stomach, no specific sourse of bleeding  A     hiatal hernia was found (see image9, image7, image3, and image2).     2 cm sliding hiatal hernia  Otherwise the examination was normal     (see image5).    Retroflexed views revealed no abnormalities.     The scope was then withdrawn from the patient and the procedure     completed.           COMPLICATIONS:  None           ENDOSCOPIC IMPRESSION:     1) Mild gastritis     2) Hiatal hernia     3) Otherwise normal examination     s/p gastric and small bowl biopsies     RECOMMENDATIONS:           REPEAT EXAM:  In 0 year(s)  for.           ______________________________     Hedwig Morton. Juanda Chance, MD           CC:           n.     eSIGNED:   Hedwig Morton. Julicia Krieger at 02/04/2010 04:29 PM           Chase Caller, [phone]  Note: An exclamation mark (!) indicates a result that was not dispersed into the flowsheet. Document Creation Date: 02/04/2010 4:30 PM _______________________________________________________________________  (1) Order result status: Final Collection or observation date-time: 02/04/2010 16:23 Requested date-time:  Receipt date-time:  Reported date-time:  Referring Physician:   Ordering Physician: Lina Sar [phone]) Specimen Source:  Source: Launa Grill Order Number: [id number] Lab site:

## 2010-11-18 NOTE — Progress Notes (Signed)
Summary: talk to nurse about meds  Phone Note Call from Patient Call back at [phone]   Caller: claire (daughter) Call For: wert Reason for Call: Talk to Nurse Summary of Call: needs to talk to nurse regarding her meds. if you cant get a hold of clair then you can call mrs [facility] house at [phone] Initial call taken by: Valinda Hoar,  January 10, 2010 12:54 PM  Follow-up for Phone Call        called and spoke with pt's daughter, Alan Ripper.  Alan Ripper states pt last saw MW 12-31-2009.  At that visit, pt was started on Duoneb, and then Prilosec in AM and Pepcid in PM.  Pt d/c'd Spiriva and Advair.  Daughter states pt is following all of MW's instructions.  Daughter states pt was scheduled to see MW yesterday but cancelled d/t feeling "weak, shaky, dizzy, and light headed." Daughter wanted to know if any of these new meds she is now on could be causing this.  Daughter states pt is "very sensitive to meds."  Please advise.  Thanks.  Aundra Millet Reynolds LPN  January 10, 2010 1:55 PM  doubt this is from meds but should have been seen before attempting to treat over the phone, options are to use the duoneb only when breathing is really bad or to use half the dose on each treatment, or see Dr Waynard Edwards (he needs copy of this phone note) Follow-up by: Nyoka Cowden MD,  January 10, 2010 3:47 PM  Additional Follow-up for Phone Call Additional follow up Details #1::        pt daughter advised and note faxed to Dr. Waynard Edwards. Carron Curie CMA  January 10, 2010 3:52 PM

## 2010-11-18 NOTE — Letter (Signed)
Summary: [facility]  [facility]   Imported By: Lennie Odor 11/28/2009 17:40:41  _____________________________________________________________________  External Attachment:    Type:   Image     Comment:   External Document

## 2010-11-18 NOTE — Assessment & Plan Note (Signed)
Summary: Pulmonary/ ext f/u ov    Copy to:  n/a Primary Provider/Referring Provider:  Redge Gainer. Perini, MD   CC:  6 wk followup with PFT's.  Pt states that her breathing has improved a little since last seen.  She is also coughing less and now cough is prod with clear sputum.  Marland Kitchen  History of Present Illness: 89 yowf still smoking but 02 dep copd with FEV1 .95 ( 54%) ratio 41 February 10, 2010   December 31, 2009 cc indolent onset 08/2009  progressive worsening  dry mouth and loss of appetite with 30 lb wt loss and now on 02 24 hour per day and only able room to room , mild dry congested cough.  rec Prednisone 4 each am x 2days, 2x2days, 1x2days and stop  Duoneb up to 4 x times a day and use proaire as needed  Stop spiriva and advair Prilosec 20mg  Take  one 30-60 min before first meal of the day and pepcid 20 mg one at bedtime   February 10, 2010 6 wk followup with PFT's.  Pt states that her breathing has improved a little since last seen.  She is also coughing less and now cough is prod with clear sputum.   Not taking prilosec before meals but has found doesn't need the neb but twice daily.  Pt denies any significant sore throat, dysphagia, itching, sneezing,  nasal congestion or excess secretions,  fever, chills, sweats, unintended wt loss, pleuritic or exertional cp, hempoptysis, change in activity tolerance  orthopnea pnd or leg swelling Pt also denies any obvious fluctuation in symptoms with weather or environmental change or other alleviating or aggravating factors.     Pt denies any increase in rescue therapy over baseline, denies waking up needing it or having early am exacerbations of coughing/wheezing/ or dyspnea   Current Medications (verified): 1)  Crestor 10 Mg  Tabs (Rosuvastatin Calcium) .... Once Daily 2)  Adult Aspirin Low Strength 81 Mg  Tbdp (Aspirin) .... Once Daily 3)  Vitamin D 2000 Iu .... Once Daily 4)  Calcium With Vit D 600mg  .... Two Times A Day 5)  Flonase 50 Mcg/act  Susp  (Fluticasone Propionate) .... As Needed 6)  Proair Hfa 108 (90 Base) Mcg/act Aers (Albuterol Sulfate) .... 2 Puffs Every 4-6 Hours As Needed 7)  Duoneb 0.5-2.5 (3) Mg/54ml Soln (Ipratropium-Albuterol) .... One in Nebulizer Up To 4 Times Per Day As Needed 8)  Lexapro 10 Mg Tabs (Escitalopram Oxalate) .... 1/2 Tablet Daily 9)  Align  Caps (Probiotic Product) .... One Capsule By Mouth Once Daily 10)  Pepcid Ac Maximum Strength 20 Mg Tabs (Famotidine) .... One Tablet By Mouth At Bedtime 11)  Megace Oral 40 Mg/ml Susp (Megestrol Acetate) .... One Teaspoon (10 Oz)  Once Daily 12)  Omeprazole 20 Mg Cpdr (Omeprazole) .... One Tablet By Mouth Once Daily 13)  Saline Nasal Spray 0.65 % Soln (Saline) .Marland Kitchen.. 1 To 2 Sprays Once Daily As Needed  Allergies (verified): No Known Drug Allergies  Past History:  Past Medical History: TOBACCO ABUSE (ICD-305.1) ATHEROSCLEROTIC CARDIOVASCULAR DISEASE (ICD-429.2) LEUKOCYTOSIS (ICD-288.60) OSTEOARTHRITIS (ICD-715.90) ALLERGIC RHINITIS (ICD-477.9) HYPERLIPIDEMIA (ICD-272.4) OSTEOPOROSIS (ICD-733.00) COPD (ICD-496)      - PFT's 01/19/07  FEV1 1.33 (72) with ratio of 48 and DLC0 54%      - PFT/s 2/25/11FEV1 .95(54) ratio 41with DLC0 53%      - HFA 50% December 31, 2009 > 75% February 10, 2010   Vital Signs:  Patient profile:  77 year old female Weight:      95 pounds O2 Sat:      95 % on 4 L/min Temp:     98.6 degrees F oral Pulse rate:   104 / minute BP sitting:   120 / 78  (left arm)  Vitals Entered By: Vernie Murders (February 10, 2010 1:40 PM)  O2 Flow:  4 L/min  Serial Vital Signs/Assessments:  Comments: 2:10 PM Ambulatory Pulse Oximetry  Resting; HR__92__    02 Sat__95% on room air___  Lap1 (185 feet)   HR__107___   02 Sat_90% on room air___ Lap2 (185 feet)   HR__111___   02 Sat__90% on room air__    Lap3 (185 feet)   HR_____   02 Sat_____  ___Test Completed without Difficulty _x_Test Stopped due to:  the patient said she was tired and beginning to  feel short of breath after walking 2 laps.  By: Michel Bickers CMA    Physical Exam  Additional Exam:  wt  96 December 31, 2009 > 95 February 10, 2010  HEENT mild turbinate edema.  Oropharynx no thrush or excess pnd or cobblestoning.  No JVD or cervical adenopathy. Mild accessory muscle hypertrophy. Trachea midline, nl thryroid. Chest was hyperinflated by percussion with diminished breath sounds and marked increased exp time without wheeze. Hoover sign positive onset of inspiration. Regular rate and rhythm without murmur gallop or rub or increase P2.  No edema.  Decrease s1s2 Abd: no hsm, nl excursion. Ext warm without cyanosis or clubbing.    Impression & Recommendations:  Problem # 1:  COPD (ICD-496) GOLD II/III now in setting of continued  smoking  (see below)  Each maintenance medication was reviewed in detail including most importantly the difference between maintenance prns and under what circumstances the prns are to be used.  In addition, these two groups (for which the patient should keep up with refills) were distinguished from a third group :  meds that are used only short term with the intent to complete a course of therapy and then not refill them.  The med list was then fully reconciled and reorganized to reflect this important distinction.   Problem # 2:  TOBACCO ABUSE (ICD-305.1) Discussed but not ready to committ to quit at this point - emphasized risks involved in continuing smoking and that patient should consider these in the context of the cost of smoking relative to the benefit obtained.   Problem # 3:  RESPIRATORY FAILURE, CHRONIC (ICD-518.83) With DLC0 53% would expect sats to be ok sitting and slow adls so rx adjusted accordingly. See instructions for specific recommendations   Medications Added to Medication List This Visit: 1)  Saline Nasal Spray 0.65 % Soln (Saline) .Marland Kitchen.. 1 to 2 sprays once daily as needed 2)  Duoneb 0.5-2.5 (3) Mg/60ml Soln (Ipratropium-albuterol) ....  One in nebulizer up to 4 times per day as needed 3)  * 02 2lpm At Bedtime and As Needed Otherwise   Other Orders: T-2 View CXR (71020TC) Prescription Created Electronically [id number]) Est. Patient Level IV (60454)  Patient Instructions: 1)  Prilosec 20 Take  one 30-60 min before first meal of the day and continue pepcid 20 mg one at bedtime.  2)  Stop smoking before it stops you 3)  Return to office in 3 months, sooner if needed 4)  Ok to use 02 automatically at bedtime and as needed during the day if short of breath with activity Prescriptions: DUONEB 0.5-2.5 (3) MG/3ML SOLN (IPRATROPIUM-ALBUTEROL) one  in nebulizer up to 4 times per day as needed  #120 x 11   Entered and Authorized by:   Nyoka Cowden MD   Signed by:   Nyoka Cowden MD on 02/10/2010   Method used:   Electronically to        [facility]       [address]       [place], [place]  [address]       Ph: [phone]       Fax: [phone]   RxID:   [id number]   Appended Document: Pulmonary/ ext f/u ov  cxr reviewed after ov:  Comparison: 01/19/2007 and CT chest 11/15/2006   Findings: Trachea is midline.  Heart size normal.  Thoracic aorta is calcified.  Biapical pleural parenchymal scarring.  COPD.  Added density in both lower hemithoraces is most likely due to overlying breast tissue.  No pleural fluid.   IMPRESSION: COPD without acute finding.    Appended Document: Pulmonary/ ext f/u ov  have pt reduce 02 to 2lpm at bedtime and as needed during the day at 2lpm (not 4)  Appended Document: Pulmonary/ ext f/u ov  LMOMTCB  Appended Document: Pulmonary/ ext f/u ov  lmomtcb  Appended Document: Pulmonary/ ext f/u ov  Spoke with pt and notified of results/recs per MW.  Pt verbalized understanding.

## 2010-11-18 NOTE — Progress Notes (Signed)
Summary: TRIAGE-Wt. loss, poor appetite   Phone Note From Other Clinic Call back at [phone]   Caller: Malachi Bonds Call For: Dr. Juanda Chance Reason for Call: Schedule Patient Appt Summary of Call: pt has no appetite and weight loss... Dr. Waynard Edwards would like pt seen asap Initial call taken by: Vallarie Mare,  November 28, 2009 9:33 AM  Follow-up for Phone Call        Pt. can see Dr.Talan Gildner on 11-29-09 at 1pm. Malachi Bonds will advise pt. of med.list/co-pay/cx.policy and she will fax records to [phone]. Follow-up by: Laureen Ochs LPN,  November 28, 2009 9:50 AM

## 2010-11-20 NOTE — Assessment & Plan Note (Signed)
Summary: Pulmonary/ f/u ov still smoking   Copy to:  n/a Primary Provider/Referring Provider:  Redge Gainer. Perini, MD   CC:  Dyspnea and cough- the same.  History of Present Illness: 78  yowf still smoking but noct 02 dep copd with FEV1 .95 ( 54%) ratio 41 February 10, 2010   December 31, 2009 cc indolent onset 08/2009  progressive worsening  dry mouth and loss of appetite with 30 lb wt loss and now on 02 24 hour per day and only able room to room , mild dry congested cough.  rec Prednisone 4 each am x 2days, 2x2days, 1x2days and stop  Duoneb up to 4 x times a day and use proaire as needed  Stop spiriva and advair Prilosec 20mg  Take  one 30-60 min before first meal of the day and pepcid 20 mg one at bedtime   February 10, 2010 6 wk followup with PFT' c/w GOLD II copd.  Pt states that her breathing has improved a little since last seen.  She is also coughing less and now cough is prod with clear sputum.   Not taking prilosec before meals but has found doesn't need the neb but twice daily at this point. rec stop smoking, no change rx  May 05, 2010 3 month followup- breathing is the same- no better or worse.  No new complaints today.  Still smoking. no increase need for rescue, no change doe. rec stop smoking continue ppi ac daily and h2 hs.  July 23, 2010 cc breathing is the same- no better or worse.  She c/o nasal congestion and "tickle in throat" x 2 months.  rec trial of symbicort and flonase  November 12, 2010 ov Dyspnea and cough- the same no increase need for duoneb. nasal congestion better.  Pt denies any significant sore throat, dysphagia, itching, sneezing,  excess  or purulent secretions,  fever, chills, sweats, unintended wt loss, pleuritic or exertional cp, hempoptysis, change in activity tolerance  orthopnea pnd or leg swelling       Preventive Screening-Counseling & Management  Alcohol-Tobacco     Smoking Status: current     Smoking Cessation Counseling: yes  Current Medications  (verified): 1)  Lipitor 80 Mg Tabs (Atorvastatin Calcium) .... 1/2 At Bedtime 2)  Adult Aspirin Low Strength 81 Mg  Tbdp (Aspirin) .... Once Daily 3)  Vitamin D 1000 Unit Tabs (Cholecalciferol) .Marland Kitchen.. 1 Once Daily 4)  Calcium With Vit D 600mg  .... Two Times A Day 5)  Flonase 50 Mcg/act  Susp (Fluticasone Propionate) .Marland Kitchen.. 1-2 Sprays Every 12 Hours 6)  Lexapro 10 Mg Tabs (Escitalopram Oxalate) .... 1/2 Tablet Daily 7)  Align  Caps (Probiotic Product) .... One Capsule By Mouth Once Daily 8)  Omeprazole 20 Mg Cpdr (Omeprazole) .... Take  One 30-60 Min Before First Meal of The Day 9)  Saline Nasal Spray 0.65 % Soln (Saline) .Marland Kitchen.. 1 To 2 Sprays Once Daily As Needed 10)  Duoneb 0.5-2.5 (3) Mg/65ml Soln (Ipratropium-Albuterol) .... One in Nebulizer Up To 4 Times Per Day As Needed 11)  * 02 2lpm At Bedtime and As Needed Otherwise 12)  Pepcid 20 Mg Tabs (Famotidine) .... Take One By Mouth At Bedtime 13)  Symbicort 160-4.5 Mcg/act  Aero (Budesonide-Formoterol Fumarate) .... 2 Puffs First Thing  in Am and 2 Puffs Again in Pm About 12 Hours Later 14)  Proair Hfa 108 (90 Base) Mcg/act Aers (Albuterol Sulfate) .... 2 Puffs Every 4-6 Hours As Needed  Allergies (verified): No  Known Drug Allergies  Past History:  Past Medical History: TOBACCO ABUSE (ICD-305.1) ATHEROSCLEROTIC CARDIOVASCULAR DISEASE (ICD-429.2) LEUKOCYTOSIS (ICD-288.60) OSTEOARTHRITIS (ICD-715.90) ALLERGIC RHINITIS (ICD-477.9) HYPERLIPIDEMIA (ICD-272.4) OSTEOPOROSIS (ICD-733.00) COPD (ICD-496)      - PFT's 01/19/07  FEV1 1.33 (72) with ratio of 48 and DLC0 54%      - PFT/s 2/25/11FEV1 .95(54) ratio 41with DLC0 53%      - HFA 50% December 31, 2009 > 75% February 10, 2010 > 75% May 05, 2010 >  90% July 23, 2010   Vital Signs:  Patient profile:   77 year old female Weight:      98.25 pounds BMI:     18.04 O2 Sat:      93 % on Room air Temp:     97.5 degrees F oral Pulse rate:   72 / minute BP sitting:   122 / 70  (left arm)  Vitals  Entered By: Vernie Murders (November 11, 2010 5:11 PM)  O2 Flow:  Room air  Serial Vital Signs/Assessments:  Comments: 5:13 PM Ambulatory Pulse Oximetry  Resting; HR__87___    02 Sat__94%ra___  Lap1 (185 feet)   HR__101___   02 Sat__91%ra___ Lap2 (185 feet)   HR_____   02 Sat_____    Lap3 (185 feet)   HR_____   02 Sat_____  ___Test Completed without Difficulty _x__Test Stopped due to:pt c/o sob   By: Vernie Murders    Physical Exam  Additional Exam:  wt  96 December 31, 2009 > 95 February 10, 2010 > 98 May 05, 2010 > 101 July 23, 2010 > 98 November 12, 2010  HEENT mild turbinate edema.  Oropharynx no thrush or excess pnd or cobblestoning.  No JVD or cervical adenopathy. Mild accessory muscle hypertrophy. Trachea midline, nl thryroid. Chest was hyperinflated by percussion with diminished breath sounds and marked increased exp time without wheeze. Hoover sign positive onset of inspiration. Regular rate and rhythm without murmur gallop or rub or increase P2.  No edema.  Decrease s1s2 Abd: no hsm, nl excursion. Ext warm without cyanosis or clubbing.    Impression & Recommendations:  Problem # 1:  COPD (ICD-496) GOLD II/II  DDX of  difficult airways managment all start with A and  include Adherence, Ace Inhibitors, Acid Reflux, Active Sinus Disease, Alpha 1 Antitripsin deficiency, Anxiety masquerading as Airways dz,  ABPA,  allergy(esp in young), Aspiration (esp in elderly), Adverse effects of DPI,  Active smokers, plus one B  = Beta blocker use.Marland Kitchen   Active smoking biggest issue, see #2  Adherence:  I spent extra time with the patient today explaining optimal mdi  technique.  This improved from  75 to 90% with coaching so try symbicort 160 2 puffs first thing  in am and 2 puffs again in pm about 12 hours later and see if this improves breathing and reduces need for proaire  - next step is add spiriva but if do so stop the duoneb  Problem # 2:  TOBACCO ABUSE (ICD-305.1)   I emphasized  that although we never turn away smokers from the pulmonary clinic, we do ask that they understand that the recommendations that were made won't work nearly as well in the presence of continued cigarette exposure and we may reach a point where we can't help the patient if he/she can't quit smoking.    Orders: Est. Patient Level III (04540) Tobacco use cessation intermediate 3-10 minutes (98119)  Medications Added to Medication List This Visit: 1)  Lipitor  80 Mg Tabs (Atorvastatin calcium) .... 1/2 at bedtime  Patient Instructions: 1)  Take prednisone 10 mg 4 each am x 2 days,  3 x 2days, 2x2 days, and 1x2 days  2)  Continue symbicort 160 2 puffs first thing  in am and 2 puffs again in pm about 12 hours later  3)  If not satisfied we may need to start back spiriva but if we do you'll need to stop the nebulizer and just use the proaire (albuterol) up to every 4 hours if needed

## 2010-11-20 NOTE — Letter (Signed)
Summary: [facility]  [address]   [place], [place] [address]   Phone: [phone]  Fax: [phone]    11/11/2010  Chase Caller [address] [place], [place]  [address]  Dear Ms. Laural Benes,   Take prednisone 10 mg 4 each am x 2 days,  3 x 2days, 2x2 days, and 1x2 days   Continue symbicort 160 2 puffs first thing  in am and 2 puffs again in pm about 12 hours later   If not satisfied we may need to start back spiriva but if we do you'll need to stop the nebulizer and just use the proaire (albuterol) up to every 4 hours if needed      Sincerely,   Sandrea Hughs MD

## 2011-02-11 ENCOUNTER — Other Ambulatory Visit: Payer: Self-pay | Admitting: Internal Medicine

## 2011-03-03 NOTE — Assessment & Plan Note (Signed)
[facility]                         GASTROENTEROLOGY OFFICE NOTE   TANYA, CROTHERS                    MRN:          [id number]  DATE:09/29/2007                            DOB:          [birth year]    Ms. Devoss is a very nice, 77 year old, white female, former patient of  Dr. Seymour Bars. She is here today to discuss colorectal screening. She is  an oxygen-dependent patient with COPD, osteoporosis, hyperlipidemia, and  allergic rhinitis. She has seen Dr. Sherene Sires from pulmonary standpoint on  January 19, 2007 and she still continues to smoke. She has never had a  colorectal screening although she has been reminded of it by Dr. Waynard Edwards  for several years. There is no family history of colon cancer. Her bowel  habits has changed somewhat over the past several years to having less  frequent stools. She is less active, she eats less and takes a lot of  medication for her pulmonary condition.   MEDICATIONS:  1. Crestor 10 mg p.o. daily.  2. Calcium with vitamin D b.i.d.  3. Vitamin D 1000 units daily.  4. Advair 250/50 b.i.d.  5. Evista 60 mg p.o. daily.   PAST MEDICAL HISTORY:  Significant for emphysema 3 or 4 years,  allergies.   FAMILY HISTORY:  No history of colon cancer.   SOCIAL HISTORY:  Widowed. She is a Engineer, maintenance (IT), lives alone. She  has 2 children. She continues to smoke and drinks alcohol socially.   REVIEW OF SYSTEMS:  Positive for eyeglasses, allergies, recurrent cough,  blood in urine, shortness of breath.   PHYSICAL EXAMINATION:  Blood pressure 160/78, pulse 88, weight 125  pounds.  She was thin, alert and oriented. Had deep voice, oral cavity normal.  NECK:  Supple. No adenopathy.  LUNGS:  Decreased breath sounds. No wheezes or rales. She had a  productive cough.  COR:  With wide precordium. Normal S1, normal S2.  ABDOMEN:  Soft, nontender with normal active bowel sounds. Decreased  muscle tone.  RECTAL:  Normal rectal tone.  Stool was soft, Hemoccult negative.   IMPRESSION:  A 77 year old white female who has never had a colonoscopy.  She has no family history of colon cancer. Her bowel habits have changed  somewhat. This could be explained by environmental changes such as  decreased activity, medications and decreased food intake. We need to  rule out colon polyps, diverticulosis, or partial colon obstruction  which is unlikely.   PLAN:  1. Colonoscopy scheduled using routine colonoscopy prep.  2. I have discussed with the patient conscious sedation, the use of      oxygen and monitoring devices during the procedure which will be      done in [facility]. We will have to monitor her oxygen saturations      throughout the procedure.     Hedwig Morton. Juanda Chance, MD  Electronically Signed    DMB/MedQ  DD: 09/29/2007  DT: 09/30/2007  Job #: [id number]   cc:   Loraine Leriche A. Perini, M.D.

## 2011-03-06 NOTE — Assessment & Plan Note (Signed)
[facility]                             PULMONARY OFFICE NOTE   NAME:JOHNSONShandie, Bertz                    MRN:          [id number]  DATE:12/07/2006                            DOB:          15-Apr-1931    HISTORY:  This is a 77 year old white female with new onset hypoxemic  respiratory failure felt to be due to COPD exacerbation with evidence of  increased interstitial markings of unclear etiology during her  hospitalization January 28 through November 20, 2006 (see my dictation  note and discharge summary by Dr. Waynard Edwards).  She had normal ejection  fraction with mild aortic valve and mitral valve regurgitation with mild  left atrial abnormality.  She was discharged on Advair 250/50 one  b.i.d., DuoNeb, and Combivent, and returns today for followup stating  she is feeling much better except for mild nasal congestion.  She  denies any purulent sputum production, fevers, chills, sweats,  orthopnea, PND or leg swelling.  She has already tried Claritin with  minimum benefit.   PHYSICAL EXAMINATION:  GENERAL:  She is a pleasant but anxious  ambulatory white female in no acute distress.  She is afebrile with  normal vital signs.  HEENT:  Unremarkable, oropharynx clear.  LUNG FIELDS:  Clear bilaterally to auscultation and percussion, although  breath sounds are diminished.  There is positive Hoover sign at the end  of inspiration.  HEART:  Regular rate and rhythm without murmur, gallop, rub.  ABDOMEN:  Soft, benign.  EXTREMITIES:  Warm without calf tenderness, cyanosis, clubbing, or  edema.   Hemoglobin saturation 92% on 2 L nasal prongs.   IMPRESSION:  I had an extended discussion with this patient lasting 15-  20 minutes of a 25-minute visit on the following topics:   For now she appears to be chronically oxygen dependent and has evidence  of both chronic obstructive pulmonary disease and interstitial lung  disease, both of which I hope will see  significant symptomatic  improvement from maintaining smoking abstinence, which she assures me  she has done since the day of her admission on January 28.  In this  setting, we may see a shifting goalpost in terms of her O2 and  medication demands.  For now, I think it makes sense to use the Advair  perfectly regularly but shift the DuoNeb and Combivent over to every 4  hours p.r.n. rather than for her to use it continuously when she has no  symptoms.  Certainly, if she has symptoms of increased dyspnea or cough  she could increase the dose up to every 4 hours, and if she has no  symptoms decrease the dose down to zero (since there is no preventive  benefit from using these two products).   This one suggestion took me about 10 minutes to work through for the  patient for reasons that are not clear.  I gave her many practical  examples of where she is now versus where she was then (including her  ability to go in and out of the grocery store without any difficulty,  the absence of nocturnal complaints  of dyspnea, cough, etc.).   In terms of the interstitial lung disease, this will need follow up but  hopefully just represents a form of ILD associated with smoking (EG,  RBILD, or DIP). I would like to see her back for a full set of PFTs and  chest x-ray in 6 weeks, sooner if needed.     Charlaine Dalton. Sherene Sires, MD, [facility]  Electronically Signed    MBW/MedQ  DD: 12/08/2006  DT: 12/08/2006  Job #: [id number]   cc:   Loraine Leriche A. Perini, M.D.

## 2011-03-06 NOTE — Consult Note (Signed)
Janice Tucker, MCCATHERN             ACCOUNT NO.:  [id number]   MEDICAL RECORD NO.:  [id number]          PATIENT TYPE:  INP   LOCATION:  4713                         FACILITY:  [facility]   PHYSICIAN:  Charlaine Dalton. Sherene Sires, MD, FCCPDATE OF BIRTH:  09-12-1931   DATE OF CONSULTATION:  11/17/2006  DATE OF DISCHARGE:                                 CONSULTATION   REASON FOR CONSULTATION:  Recurrent episodes of respiratory distress.   HISTORY:  This is a 77 year old white female active smoker who carries a  diagnosis COPD and states in between exacerbations feels fine as long as  she uses her nebulizer and has access to her Combivent.  However, she  had been going downhill since November, she says, with more frequent  flare-ups requiring antibiotics and prednisone per Dr. Laurey Morale office.  She has been tried on Advair before but says it is too expensive.  She  continued to smoke until her present exacerbation that occurred several  days prior to admission with increasing symptoms of coughing, congestion  with discolored sputum that has now turned white while on antibiotics.  She is now on day three of hospitalization and feels nearly back to her  normal self, but the issue is how to prevent exacerbations.   She denies any pleuritic chest pain, active sinus symptoms, active  purulent sputum, orthopnea, PND or significant leg swelling, itching,  sneezing or history of atopy or subjective wheezing.   Presently on her best day, the patient says she can do pretty much  anything she wants, but has given up heavy housework and climbing lots  of steps.  She is able to walk around the mall and grocery stores  without any difficulty and also across parking lots without difficulty,  and also is able to sleep fine at night.  She does have a typical  smoker's cough that is worse in the mornings for up to an hour, and also  worse in the winters with thick white mucus production.   PAST MEDICAL HISTORY:  1.  Postmenopausal osteoporosis.  2. DJD.  3. Ischemic heart disease based on heavy coronary calcifications by CT      scan of the chest remotely.  4. Allergic rhinitis.   ALLERGIES:  1. SCOPOLAMINE.  2. MUCINEX.  3. CRESTOR.  4. ASPIRIN (GI upset).   MEDICATIONS AS AN OUTPATIENT:  1. Evista.  2. Calcium.  3. Combivent.  4. Advair.  5. DuoNeb.  6. Crestor.  7. Aspirin.  8. Flonase.  9. Loratadine.  10.Vitamin D, but note she is not consistent about using her Advair.   SOCIAL HISTORY:  She quit smoking at the time of admission.  Her husband  died apparently of lung cancer.   FAMILY HISTORY:  Positive for atherosclerotic disease with her father  having a massive stroke at age 4.  Mother, however, lived to be 22 and  had psychiatric issues.  Her sister is in good health.   REVIEW OF SYSTEMS:  Taken in detail and essentially negative, except as  outlined above.   PHYSICAL EXAMINATION:  GENERAL:  This is a spunky, but pleasant  77-  year-old white female in no acute distress.  VITAL SIGNS:  Afebrile, normal vital signs.  Her last saturation was 94%  on 4 liters nasal prong.  HEENT:  Remarkable for upper and lower dentures.  Oropharynx was clear.  NECK:  Supple without cervical adenopathy or tenderness.  Trachea is  midline.  No thyromegaly.  LUNGS:  The lung fields reveal diminished breath sounds with  hyperresonance to percussion and Hoover sign is positive at the end of  inspiration; however, at present there is no wheezing.  HEART:  There is a regular rhythm.  There is an S1 and S2.  ABDOMEN:  Soft, benign.  EXTREMITIES:  Warm without calf tenderness, cyanosis or clubbing.   CHEST X-RAY:  The chest x-ray was reviewed from November 15, 2006 with  COPD changes that were mild to moderate.   CT SCAN OF THE CHEST:  A CT scan of the chest on November 15, 2006  suggests prominent interstitial markings and severe emphysema.   IMPRESSION:  Hypoxemic respiratory failure, presently  O2 dependent, in  the setting of a long history of chronic obstructive pulmonary disease  with active smoking against medical advice.  Note that her bicarbonate  is at the upper limits of normal, so she probably has mild elevation of  pCO2, but actually the amount of her airflow is relatively well-  preserved at present considering the severity of her disease and the  frequency of exacerbations.   I told the patient I would be happy to work with her as an outpatient if  she will return to the office for PFTs.  However, if she continues to  smoke, there is little I have to offer her that Dr. Waynard Edwards has not  already thought of.  The main message she needs to get is that there is  no antidote to smoking (I am sure she has heard this from Dr. Waynard Edwards,  as well), and that the sooner she stops smoking, the more likely she  will respond to medicines and that her progressive decline in lung  function that she has noticed will have stabilized.  However, at  present, I did not recommend any change in her present regimen.   In terms of her interstitial process, this also should clear with the  elimination of smoking, as it most likely represents a respiratory  bronchiolitis/interstitial lung disease associated with smoking.  The  only way to prove me wrong is to stop smoking and see if it persists.  Time will tell in this regard whether she has interstitial lung disease,  but there is nothing further to do this admission.      Charlaine Dalton. Sherene Sires, MD, [facility]  Electronically Signed     MBW/MEDQ  D:  11/17/2006  T:  11/17/2006  Job:  [id number]   cc:   Loraine Leriche A. Perini, M.D.

## 2011-03-06 NOTE — H&P (Signed)
NAMECALLAWAY, HARDIGREE             ACCOUNT NO.:  [id number]   MEDICAL RECORD NO.:  [id number]          PATIENT TYPE:  INP   LOCATION:  1829                         FACILITY:  [facility]   PHYSICIAN:  Mark A. Perini, M.D.   DATE OF BIRTH:  April 26, 1931   DATE OF ADMISSION:  11/15/2006  DATE OF DISCHARGE:                              HISTORY & PHYSICAL   CHIEF COMPLAINT:  Shortness of breath.   HISTORY OF PRESENT ILLNESS:  Janice Tucker is a pleasant 77 year old female  with history significant for COPD, as well as osteoporosis, tobacco  abuse, osteoarthritis and allergic rhinitis.  She developed some nausea  and vomiting approximately 6 days ago, then developed a runny nose.  Over the last 2 to 3 days, her DuoNeb nebulizer and Combivent has  offered her no help at all with her shortness of breath.  She has had  anorexia and has been unable to eat or drink well.  She has had no  edema.  She does have a productive cough with some thick mucus noted.  She had some chest pain and soreness in the right side of her chest  yesterday, which moved to the left side today, but when she coughs it  resolves, and currently she has no chest pain.  In our office, she was  found to have a room air saturation of 77%, which came up to 88 to 89%  on 3 liters of oxygen per nasal cannula.  She is admitted for further  evaluation and treatment.   PAST MEDICAL HISTORY:  1. Osteoporosis.  2. Postmenopausal.  3. G5, P2, SAB3 parity status.  4. History of tonsillectomy and adenoidectomy.  5. Tobacco abuse with ongoing smoking.  6. Negative workup for microscopic hematuria in 2003.  7. COPD since at least 2002.  8. Osteoarthritis.  9. Heavy coronary artery atherosclerosis noted by CT scan of the chest      in February of 2006.  10.Allergic rhinitis.   ALLERGIES:  Scopolamine caused muscle problems, Mucinex caused  constipation, Crestor caused stomach and diarrhea problems, although she  is actually tolerating  Crestor currently, and aspirin has upset her  stomach in the past, but again she is tolerating aspirin 2 or 3 times a  week currently.   CURRENT MEDICATIONS:  1. Evista 60 mg daily.  2. Calcium with D 600 mg twice a day.  3. Combivent twice a day.  4. Advair 250/50 one puff twice a day.  5. DuoNeb nebulizer 2-3 times a day.  6. Crestor 1/2 of a 20-mg tablet daily.  7. Aspirin 81 mg 2-3 times a week.  8. Flonase nasal spray daily.  9. Loratadine 10 mg daily.  10.Vitamin D 2000 units daily.   SOCIAL HISTORY:  She has been widowed since 1994.  She has 2 daughters  and no grandchildren.  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  She has had a pack and a half per day, history of smoking since  1950 and continues to smoke.  She drinks 2 glasses of wine per day.  No  drug use.  FAMILY HISTORY:  Father died at age 72 of a massive stroke and  emphysema.  Mother died at age 86 in the nursing home and had emotional  problems.  She has one living sister, and her children are in good  health.   REVIEW OF SYSTEMS:  As per the history of present illness.   PHYSICAL EXAMINATION:  VITAL SIGNS:  Temperature 97.7, pulse ranged from  120-150 in the office and is currently down to 104.  Weight is 116  pounds, which is down 9 pounds from her last visit on September 24, 2006,  blood pressure 140/80 initially.  Now, it is 135/79.  Saturation was 77%  on room air and is now in 90-95% on 4 liters nasal cannula oxygen.  She  is in no acute distress and was actually breathing fairly comfortably.  NECK:  There was no JVD.  She has decreased breath sounds bilaterally  with no significant wheezes, rales, rhonchi noted.  HEART:  Tachycardic with no significant murmur, rub or gallop.  There  are 2+ peripheral pulses.  ABDOMEN:  Soft and nontender.  NEURO:  She is alert and oriented x4.   LABORATORY DATA:  White count is 15.3, hemoglobin 13.6, platelet count  298,000.  There were 79% segs, 6%  lymphocytes and 16% monocytes.  Other  lab data is pending at this time.  EKG shows sinus tachycardia with a  heart rate of 109 beats per minute with some right atrial enlargement,  but no ischemic changes are noted, and the rhythm is sinus.   Chest x-ray personally reviewed shows significant emphysema changes.  The radiologist reads that there are some course interstitial findings  which could be chronic versus an acute pneumonitis, and there is  possibly some evidence of some nodularity, and a chest CT scan is  recommended.   ASSESSMENT/PLAN:  Chronic obstructive pulmonary disease with an acute  exacerbation, with or without a pneumonia.  We will admit and place on a  telemetry bed.  We will treat with intravenous Solu-Medrol, as well as  nebulizers, and will also cover with IV azithromycin and Rocephin.  We  will use supplemental oxygen as well.  We will check a CT scan protocol  to rule out PE, as well as cardiac enzymes.  She is a full code status.  We will place her deep venous thrombosis and ulcer prophylaxis as well.           ______________________________  Redge Gainer. Waynard Edwards, M.D.     MAP/MEDQ  D:  11/15/2006  T:  11/15/2006  Job:  [id number]

## 2011-03-06 NOTE — Assessment & Plan Note (Signed)
[facility]                             PULMONARY OFFICE NOTE   [name], [name]                    MRN:          [id number]  DATE:01/19/2007                            DOB:          August 08, 1931    PULMONARY/SUMMARY FINAL FOLLOWUP OFFICE VISIT   HISTORY:  This is a delightful 77 year old white female who quit smoking  in January 2008 and returns as requested for PFTs. She has been using  oxygen 24 hours a day and wants to know if she can quit. She denies any  dyspnea with activities of daily living, variability in terms of any  respiratory complaints or cough, fevers, chills, sweats, chest pain or  leg swelling.   For full inventory of medications, please see face sheet, dated January 19, 2007 noting the patient is no longer on anything but Advair 250/50  b.i.d. She misunderstood my previous instructions to use Duo-neb or  Combivent p.r.n., but she appears to have not needed them anyway.   PHYSICAL EXAMINATION:  She is an ambulatory pleasant, white female in no  acute distress. She has stable vital signs.  HEENT: Is unremarkable. Oropharynx is clear.  LUNGS: Lung fields are clear to auscultation and percussion. She has  minimal inspiratory and expiratory rhonchi, right greater than left  base.  All of her breath sounds are diminished and there is  hyperresonant  percussion.  HEART: Regular rate and rhythm without murmur, gallop or rub.  ABDOMEN: Soft, benign.  EXTREMITIES: Warm without calf tenderness, cyanosis, clubbing or edema.   Chest x-ray shows minimal increase in interstitial markings. No definite  evolution of interstitial lung disease. PFTs reveal an FEV1 of 72% with  a ratio of 48%. No improvement after bronchodilators and a diffusing  capacity of 54%.   I walked the patient around the office briskly and after 185 feet, her  saturation rate is 88%, but she still felt fine. She did note that  this was faster than her normal  pace.   IMPRESSION:  1. This patient has at least moderate chronic obstructive pulmonary      disease by PFTs, with no further reversibility on present doses of      Advair, which I believe were appropriate. Advair reduces      exacerbations and hospitalizations for chronic obstructive      pulmonary disease patients and is a good choice for her given the      severity of her disease.  2. She continues to demonstrate exercise-induced hypoxemia, but does      not necessarily need to wear oxygen 24 hours a day. I have told her      to wear the oxygen if she does any more activity than what we      demonstrated she was capable of today (a brisk walk of 185 feet was      tolerated well). Otherwise, I would like her to wear the oxygen      just at bedtime and p.r.n. during the day for any dyspnea.  3. For her dyspnea, she does have either nebulized Duo-Neb or  Combivent to use p.r.n., which I have encouraged her to use if she      is symptomatic. However, there is no longterm benefit to using      these as maintenance.   I had a long and philosophical conversation with this patient related to  our intention in terms of instructions realizing that we are giving her  instructions that she is having trouble processing. I emphasized that  there are really three categories of recommendations. One is carried out  perfectly regularly until a doctor has input to change the  recommendation (such as the Advair recommendation and the oxygen at  bedtime, which I recommended today). The second is taken as needed such  as the oxygen that I mentioned today with exercise and the Duo-Neb. The  third is when we give her a short course of therapy like we did with  prednisone, but is no longer relevant.   I hope I have helped this patient in terms of simplifying her care by  breaking it into its component parts, but do not plan to see her back  regularly in the pulmonary clinic. I would certainly be  happy to see her  if Dr. Waynard Edwards or the patient feels I can be of further service however.     Janice Tucker. Janice Sires, MD, [facility]  Electronically Signed    MBW/MedQ  DD: 01/19/2007  DT: 01/19/2007  Job #: [id number]   cc:   Loraine Leriche A. Perini, M.D.

## 2011-03-06 NOTE — Discharge Summary (Signed)
NAMEJENDAYI, BERLING             ACCOUNT NO.:  [id number]   MEDICAL RECORD NO.:  [id number]          PATIENT TYPE:  INP   LOCATION:  4713                         FACILITY:  [facility]   PHYSICIAN:  Mark A. Perini, M.D.   DATE OF BIRTH:  1931-02-17   DATE OF ADMISSION:  11/15/2006  DATE OF DISCHARGE:  11/20/2006                               DISCHARGE SUMMARY   DISCHARGE DIAGNOSES:  1. Severe chronic obstructive pulmonary disease with an acute      exacerbation.  2. Hypoxia secondary to chronic obstructive pulmonary disease.  3. Osteoporosis.  4. Hyperlipidemia.  5. Atherosclerotic cardiovascular disease as noted on CT scan.  6. Leukocytosis felt to be due to steroid treatment this admission.  7. Osteoarthritis  8. Allergic rhinitis.   PROCEDURE:  1. CT angiogram of the chest performed on November 15, 2006.  This      showed no evidence of pulmonary embolism. There was severe      emphysema, chronic bronchitis, and diffuse interstitial lung      disease, and no acute cardiopulmonary disease.  There was thoracic      and abdominal aortic atherosclerosis without aneurysm or      dissection, and moderate three-vessel coronary artery calcification      noted.  There were scattered and large mediastinal and hilar lymph      nodes, the largest on the right measuring 2.9 x 2.2 cm.  No      significant axillary lymphadenopathy.  2. A 2-D echocardiogram done on November 17, 2006.  This showed mildly      dilated left ventricle with overall normal left ventricular      systolic function with ejection fraction of 55%,  mildly calcified      aortic valve with mild aortic valve regurgitation, mild mitral      valve regurgitation, and mild left atrial dilation.  3. Pulmonary consultation.   DISCHARGE MEDICATIONS:  1. She is to stop Evista.  We will use a different bone agent in the      future.  2. Calcium with D twice daily with food.  3. Advair 250/50 one puff twice a day.  4. DuoNeb  nebulizer three to four times a day.  5. Crestor one-half of a 20 mg pill daily.  6. 81 mg aspirin daily.  7. Flonase nasal spray as needed.  8. Loratadine 10 mg daily as needed.  9. Vitamin D 2000 units once a day.  10.Over-the-counter Prilosec 20 mg once daily with food.  11.Oxygen  3 L per minute continuous.  12.Prednisone taper 20 mg daily for 4 days, then 10 mg daily for four      further days, then stop.  13.Combivent inhaler if not at home.  14.Augmentin 875 mg twice a day with food for three further days.   HISTORY OF PRESENT ILLNESS:  Westley Hummer is a pleasant 77 year old female  with ongoing smoking and known significant COPD who presented to the  office with six days of runny nose and increasing shortness of breath.  In the office she was found have room air saturation of 77%  and she was  admitted for further care.   HOSPITAL COURSE:  Ms. Laroche was admitted.  She had serial cardiac  enzymes and EKGs that showed normal CK enzymes but a slightly elevated  troponin I 0.44.  EKG showed no ischemic changes and she had no chest  pain.  Therefore, it was felt that the troponin was elevated due to her  COPD.  She was treated aggressively for COPD exacerbation with oxygen,  steroids and antibiotics as well as nebulizer treatments.  She gradually  improved.  She still dropped her room air saturation to 83% on room air  with ambulation on the day prior to discharge.  Therefore, she will be  discharged on home oxygen.  Pulmonology consultation was obtained and  agreed with our current management and further stressed smoking  cessation.  On November 20, 2006, she was deemed stable for discharge  home with continued outpatient follow-up.   DISCHARGE PHYSICAL EXAMINATION:  VITAL SIGNS:  Temperature 98, pulse 88.  She is afebrile, respiratory 18, blood pressure 125/65, 96% saturation  on  3 L of oxygen. GENERAL:  She is in no acute distress.  LUNGS:  She has decreased breath sounds  bilaterally with no wheezes,  rales or rhonchi.  HEART:  Heart was regular rate and rhythm with no murmur or gallop and  there is no edema.  ABDOMEN:  Abdomen was soft and nontender.   DISCHARGE LABORATORY DATA:  White count 18.1, hemoglobin 12.8, platelet  count 333,000.  Sodium 138, potassium 4.0, chloride 98, CO2 34, BUN 16,  creatinine 0.57, glucose 92 and calcium 9.2.  Other notable laboratory  data included a BNP of 181 on November 18, 2006.   DISCHARGE INSTRUCTIONS:  Ms. Brant is to follow a low-salt, heart-  healthy diet.  She is to stop smoking.  She is to increase her activity  slowly.  She is to call for follow-up visit with Dr. Waynard Edwards in 7-10  days.  Our office was set up a pulmonology and cardiology outpatient  follow-up.  Furthermore, she will need a follow-up CT of her chest in  three to four months to reevaluate her hilar lymphadenopathy.  This is  marked for a knee thank you.           ______________________________  Redge Gainer. Waynard Edwards, M.D.     MAP/MEDQ  D:  11/20/2006  T:  11/20/2006  Job:  [id number]   cc:   Casimiro Needle B. Sherene Sires, MD, [facility]  [facility]

## 2011-06-03 ENCOUNTER — Other Ambulatory Visit: Payer: Self-pay | Admitting: Internal Medicine

## 2011-06-08 ENCOUNTER — Other Ambulatory Visit: Payer: Self-pay | Admitting: *Deleted

## 2011-06-08 MED ORDER — IPRATROPIUM-ALBUTEROL 0.5-2.5 (3) MG/3ML IN SOLN: 3.0000 mL | Freq: Four times a day (QID) | RESPIRATORY_TRACT | Status: DC | PRN

## 2011-10-08 ENCOUNTER — Other Ambulatory Visit: Payer: Self-pay | Admitting: Internal Medicine

## 2011-10-16 ENCOUNTER — Telehealth: Payer: Self-pay | Admitting: Internal Medicine

## 2011-10-16 NOTE — Telephone Encounter (Signed)
I spoke with pt and she states she needs a refill on her symbicort sent to [facility]. I advised pt rx was [facility] on [facility]. I also called [facility] to confirm they did receive rx and she states they did receive it. Pt is aware and needed nothing further

## 2011-11-23 ENCOUNTER — Encounter: Payer: Self-pay | Admitting: Internal Medicine

## 2011-11-23 ENCOUNTER — Ambulatory Visit (INDEPENDENT_AMBULATORY_CARE_PROVIDER_SITE_OTHER): Payer: Medicare Other | Admitting: Internal Medicine

## 2011-11-23 DIAGNOSIS — J449 Chronic obstructive pulmonary disease, unspecified: Secondary | ICD-10-CM

## 2011-11-23 DIAGNOSIS — F17200 Nicotine dependence, unspecified, uncomplicated: Secondary | ICD-10-CM

## 2011-11-23 DIAGNOSIS — J9610 Chronic respiratory failure, unspecified whether with hypoxia or hypercapnia: Secondary | ICD-10-CM

## 2011-11-23 MED ORDER — BUDESONIDE-FORMOTEROL FUMARATE 160-4.5 MCG/ACT IN AERO: 2.0000 | INHALATION_SPRAY | Freq: Two times a day (BID) | RESPIRATORY_TRACT | Status: DC

## 2011-11-23 MED ORDER — ACLIDINIUM BROMIDE 400 MCG/ACT IN AEPB: 1.0000 | INHALATION_SPRAY | Freq: Two times a day (BID) | RESPIRATORY_TRACT | Status: DC

## 2011-11-23 NOTE — Patient Instructions (Addendum)
Try tudorza one puff twice daily taken immediately after the symbicort. You should hear a click and the indicator turn back from green to red between puffs - if not, return here with your inhaler.  While on tudorza it's ok to use the duoneb in the short run but the sign that the tudorza is working is less need for the [employer] would likely qualify for ambulatory 02 but I would strongly recommend first you stop smoking - this will do more for you than any medication or doctor  Please schedule a follow up visit in 3 months but call sooner if needed

## 2011-11-23 NOTE — Progress Notes (Signed)
Subjective:     Patient ID: Janice Tucker, female   DOB: 1930-11-06  MRN: [id number]  HPI   Brief patient profile:  80 yowf still smoking but noct 02 dep copd with FEV1 .95 ( 54%) ratio 41 February 10, 2010   December 31, 2009 cc indolent onset 08/2009 progressive worsening dry mouth and loss of appetite with 30 lb wt loss and now on 02 24 hour per day and only able room to room , mild dry congested cough. rec Prednisone 4 each am x 2days, 2x2days, 1x2days and stop  Duoneb up to 4 x times a day and use proaire as needed  Stop spiriva and advair  Prilosec 20mg  Take one 30-60 min before first meal of the day and pepcid 20 mg one at bedtime   February 10, 2010 6 wk followup with PFT' c/w GOLD II copd. Pt states that her breathing has improved a little since last seen. She is also coughing less and now cough is prod with clear sputum. Not taking prilosec before meals but has found doesn't need the neb but twice daily at this point. rec stop smoking, no change rx   May 05, 2010 3 month followup- breathing is the same- no better or worse. No new complaints today. Still smoking. no increase need for rescue, no change doe. rec stop smoking continue ppi ac daily and h2 hs.   July 23, 2010 cc breathing is the same- no better or worse. She c/o nasal congestion and "tickle in throat" x 2 months. rec trial of symbicort and flonase   November 12, 2010 ov Dyspnea and cough- the same no increase need for duoneb. nasal congestion better.  rec Take prednisone 10 mg 4 each am x 2 days, 3 x 2days, 2x2 days, and 1x2 days  2) Continue symbicort 160 2 puffs first thing in am and 2 puffs again in pm about 12 hours later  3) If not satisfied we may need to start back spiriva but if we do you'll need to stop the nebulizer and just use the proaire (albuterol) up to every 4 hours if needed            11/23/2011 f/u ov/Janice Tucker still smoking cc doe housework, does ok room to room doesn't go out more than a couple times a  week due to exhaustion more than short breath. Minimal mucoid sputum production mostly in ams, worse in winter, wants to avoid 02 daytime, uses only hs    Past Medical History:  TOBACCO ABUSE (ICD-305.1)  ATHEROSCLEROTIC CARDIOVASCULAR DISEASE (ICD-429.2)   OSTEOARTHRITIS (ICD-715.90)  ALLERGIC RHINITIS (ICD-477.9)  HYPERLIPIDEMIA (ICD-272.4)  OSTEOPOROSIS (ICD-733.00)  COPD (ICD-496)  - PFT's 01/19/07 FEV1 1.33 (72) with ratio of 48 and DLC0 54%  - PFT/s 2/25/11FEV1 .95(54) ratio 41with DLC0 53%  - HFA 50% December 31, 2009 > 75% February 10, 2010 > 75% May 05, 2010 > 90% July 23, 2010    Review of Systems     Objective:   Physical Exam    wt 96 December 31, 2009 > 95 February 10, 2010 > 98 May 05, 2010 > 101 July 23, 2010 > 98 November 12, 2010 > 11/23/2011 100 lb  HEENT mild turbinate edema. Oropharynx no thrush or excess pnd or cobblestoning. No JVD or cervical adenopathy. Mild accessory muscle hypertrophy. Trachea midline, nl thryroid. Chest was hyperinflated by percussion with diminished breath sounds and marked increased exp time without wheeze. Hoover sign positive onset of inspiration. Regular rate and  rhythm without murmur gallop or rub or increase P2. No edema. Decrease s1s2 Abd: no hsm, nl excursion. Ext warm without cyanosis or clubbing.  Assessment:         Plan:

## 2011-11-24 NOTE — Assessment & Plan Note (Signed)
She does qualify for 02  Discussed in detail all the  indications, usual  risks and alternatives  relative to the benefits with patient who agrees to proceed with trial of tudorza and stop smoking first then regroup

## 2011-11-24 NOTE — Assessment & Plan Note (Signed)
-   PFT's 01/19/07 FEV1 1.33 (72) with ratio of 48 and DLC0 54%  - PFT/s 2/25/11FEV1 .95(54) ratio 41with DLC0 53%  - HFA 50% December 31, 2009 > 75% February 10, 2010 > 75% May 05, 2010 > 90% July 23, 2010  She has GOLD II/III copd with desats with activity but is very sedentary and really not limited by sob and wants to avoid 02 if possible Best approach would be to stop smoking and rechallenge with LAMA like Tudorza  The proper method of use, as well as anticipated side effects, of this metered-dose inhaler are discussed and demonstrated to the patient. Improved to 75%.    Each maintenance medication was reviewed in detail including most importantly the difference between maintenance and as needed and under what circumstances the prns are to be used.  Please see instructions for details which were reviewed in writing and the patient given a copy.  She is struggling with concept of medication reconciliation.   I offered our NP to help with this if desired.

## 2011-11-24 NOTE — Assessment & Plan Note (Signed)

## 2012-02-29 ENCOUNTER — Ambulatory Visit (INDEPENDENT_AMBULATORY_CARE_PROVIDER_SITE_OTHER): Payer: Medicare Other | Admitting: Internal Medicine

## 2012-02-29 ENCOUNTER — Encounter: Payer: Self-pay | Admitting: Internal Medicine

## 2012-02-29 VITALS — BP 110/68 | HR 66 | Temp 97.8°F | Ht 63.0 in | Wt 100.2 lb

## 2012-02-29 DIAGNOSIS — J449 Chronic obstructive pulmonary disease, unspecified: Secondary | ICD-10-CM

## 2012-02-29 NOTE — Assessment & Plan Note (Signed)
-   PFT/s 2/25/11FEV1 .95(54) ratio 41with DLC0 53%  - HFA 75% p coaching 02/29/2012   DDX of  difficult airways managment all start with A and  include Adherence, Ace Inhibitors, Acid Reflux, Active Sinus Disease, Alpha 1 Antitripsin deficiency, Anxiety masquerading as Airways dz,  ABPA,  allergy(esp in young), Aspiration (esp in elderly), Adverse effects of DPI,  Active smokers, plus two Bs  = Bronchiectasis and Beta blocker use..and one C= CHF   Adherence is always the initial "prime suspect" and is a multilayered concern that requires a "trust but verify" approach in every patient - starting with knowing how to use medications, especially inhalers, correctly, keeping up with refills and understanding the fundamental difference between maintenance and prns vs those medications only taken for a very short course and then stopped and not refilled. The proper method of use, as well as anticipated side effects, of a metered-dose inhaler are discussed and demonstrated to the patient. Improved effectiveness after extensive coaching during this visit to a level of approximately  75%  Still using too much saba, esp the neb.    Each maintenance medication was reviewed in detail including most importantly the difference between maintenance and as needed and under what circumstances the prns are to be used.  Please see instructions for details which were reviewed in writing and the patient given a copy.  t

## 2012-02-29 NOTE — Patient Instructions (Addendum)
Symbicort 160 Take 2 puffs first thing in am and then another 2 puffs about 12 hours later follow each dose with tudorza  Think of your respiratory medications as multiple steps you can take to control your symptoms and avoid having to go to the ER   Plan A is your maintenance daily no matter what meds: (symbicort and tudorza) Plan B only use after you've used your maintenance (Plan A) medication, and only if you can't catch your breath: proaire Plan C only use after you've used plan A and B and still can't catch your breath: add nebulizer  Plan D(for Doctor):  If you've used A thru C and not doing a lot better or still needing C more than a once a day,  D = call the doctor for evaluation asap Plan E (for ER):  If still not able to catch your breath, even after using your nebulizer up to every 4 hours, go to ER   The key is to stop smoking completely before smoking completely stops you!    Please schedule a follow up visit in 3 months but call sooner if needed

## 2012-02-29 NOTE — Progress Notes (Signed)
Subjective:     Patient ID: Janice Tucker, female   DOB: 1931-01-10  MRN: [id number]      Brief patient profile:  80 yowf still smoking but noct 02 dep copd with FEV1 .95 ( 54%) ratio 41 February 10, 2010    HPI December 31, 2009 cc indolent onset 08/2009 progressive worsening dry mouth and loss of appetite with 30 lb wt loss and now on 02 24 hour per day and only able room to room , mild dry congested cough. rec Prednisone 4 each am x 2days, 2x2days, 1x2days and stop  Duoneb up to 4 x times a day and use proaire as needed  Stop spiriva and advair  Prilosec 20mg  Take one 30-60 min before first meal of the day and pepcid 20 mg one at bedtime   February 10, 2010 6 wk followup with PFT' c/w GOLD II copd. Pt states that her breathing has improved a little since last seen. She is also coughing less and now cough is prod with clear sputum. Not taking prilosec before meals but has found doesn't need the neb but twice daily at this point. rec stop smoking, no change rx   May 05, 2010 3 month followup- breathing is the same- no better or worse. No new complaints today. Still smoking. no increase need for rescue, no change doe. rec stop smoking continue ppi ac daily and h2 hs.   July 23, 2010 cc breathing is the same- no better or worse. She c/o nasal congestion and "tickle in throat" x 2 months. rec trial of symbicort and flonase   November 12, 2010 ov Dyspnea and cough- the same no increase need for duoneb. nasal congestion better.  rec Take prednisone 10 mg 4 each am x 2 days, 3 x 2days, 2x2 days, and 1x2 days  2) Continue symbicort 160 2 puffs first thing in am and 2 puffs again in pm about 12 hours later  3) If not satisfied we may need to start back spiriva but if we do you'll need to stop the nebulizer and just use the proaire (albuterol) up to every 4 hours if needed            11/23/2011 f/u ov/Janice Tucker still smoking cc doe housework, does ok room to room doesn't go out more than a couple  times a week due to exhaustion more than short breath. Minimal mucoid sputum production mostly in ams, worse in winter, wants to avoid 02 daytime, uses only hs. rec Try tudorza one puff twice daily taken immediately after the symbicort. You should hear a click and the indicator turn back from green to red between puffs - if not, return here with your inhaler.  While on tudorza it's ok to use the duoneb in the short run but the sign that the tudorza is working is less need for the [employer] would likely qualify for ambulatory 02 but I would strongly recommend first you stop smoking - this will do more for you than any medication or doctor   02/29/2012 f/u ov/Janice Tucker cc sob better on tudorza but insurance needs rider and failed spiriva previously. No purulent sputum, some increased nasal congestion. Excessive use of daytime neb > saba hfa. Still sob walking more than 100 ft  Sleeping ok without nocturnal  or early am exacerbation  of respiratory  c/o's or need for noct saba. Also denies any obvious fluctuation of symptoms with weather or environmental changes or other aggravating or alleviating factors except  as outlined above   ROS  At present neg for  any significant sore throat, dysphagia, dental problems, itching, sneezing,    excess/ purulent nasal secretions, ear ache,   fever, chills, sweats, unintended wt loss, pleuritic or exertional cp, hemoptysis, palpitations, orthopnea pnd or leg swelling.  Also denies presyncope, palpitations, heartburn, abdominal pain, anorexia, nausea, vomiting, diarrhea  or change in bowel or urinary habits, change in stools or urine, dysuria,hematuria,  rash, arthralgias, visual complaints, headache, numbness weakness or ataxia or problems with walking or coordination. No noted change in mood/affect or memory.                     Past Medical History:  TOBACCO ABUSE (ICD-305.1)  ATHEROSCLEROTIC CARDIOVASCULAR DISEASE (ICD-429.2)   OSTEOARTHRITIS  (ICD-715.90)  ALLERGIC RHINITIS (ICD-477.9)  HYPERLIPIDEMIA (ICD-272.4)  OSTEOPOROSIS (ICD-733.00)  COPD (ICD-496)  - PFT's 01/19/07 FEV1 1.33 (72) with ratio of 48 and DLC0 54%  - PFT/s 2/25/11FEV1 .95(54) ratio 41with DLC0 53%          Objective:   Physical Exam    wt 96 December 31, 2009 > 95 February 10, 2010 > 98 May 05, 2010 > 101 July 23, 2010 > 98 November 12, 2010 > 11/23/2011 100 lb  > 100 02/29/2012  HEENT mild turbinate edema. Oropharynx no thrush or excess pnd or cobblestoning. No JVD or cervical adenopathy. Mild accessory muscle hypertrophy. Trachea midline, nl thryroid. Chest was hyperinflated by percussion with diminished breath sounds and marked increased exp time without wheeze. Hoover sign positive onset of inspiration. Regular rate and rhythm without murmur gallop or rub or increase P2. No edema. Decrease s1s2 Abd: no hsm, nl excursion. Ext warm without cyanosis or clubbing.  Assessment:         Plan:

## 2012-03-03 ENCOUNTER — Telehealth: Payer: Self-pay | Admitting: *Deleted

## 2012-03-03 NOTE — Telephone Encounter (Signed)
Waiting for fax for approval/denial of the tudorza.  Will hold this in leslie's box.

## 2012-03-08 NOTE — Telephone Encounter (Signed)
Approval letter received from [employer] for pt's tudorza thru 12.31.13.  Will place in MW's scan folder.

## 2012-06-21 ENCOUNTER — Encounter: Payer: Self-pay | Admitting: Internal Medicine

## 2012-06-21 ENCOUNTER — Ambulatory Visit (INDEPENDENT_AMBULATORY_CARE_PROVIDER_SITE_OTHER): Payer: Medicare Other | Admitting: Internal Medicine

## 2012-06-21 VITALS — BP 136/70 | HR 93 | Temp 98.1°F | Ht 63.0 in | Wt 95.0 lb

## 2012-06-21 DIAGNOSIS — J9610 Chronic respiratory failure, unspecified whether with hypoxia or hypercapnia: Secondary | ICD-10-CM

## 2012-06-21 DIAGNOSIS — F17200 Nicotine dependence, unspecified, uncomplicated: Secondary | ICD-10-CM

## 2012-06-21 DIAGNOSIS — J309 Allergic rhinitis, unspecified: Secondary | ICD-10-CM

## 2012-06-21 DIAGNOSIS — J449 Chronic obstructive pulmonary disease, unspecified: Secondary | ICD-10-CM

## 2012-06-21 MED ORDER — IPRATROPIUM BROMIDE 0.06 % NA SOLN: 2.0000 | Freq: Four times a day (QID) | NASAL | Status: DC

## 2012-06-21 NOTE — Progress Notes (Signed)
Subjective:     Patient ID: Janice Tucker, female   DOB: Oct 06, 1931  MRN: [id number]      Brief patient profile:  80 yowf still smoking but noct 02 dep copd with FEV1 .95 ( 54%) ratio 41 February 10, 2010    HPI December 31, 2009 cc indolent onset 08/2009 progressive worsening dry mouth and loss of appetite with 30 lb wt loss and now on 02 24 hour per day and only able room to room , mild dry congested cough. rec Prednisone 4 each am x 2days, 2x2days, 1x2days and stop  Duoneb up to 4 x times a day and use proaire as needed  Stop spiriva and advair  Prilosec 20mg  Take one 30-60 min before first meal of the day and pepcid 20 mg one at bedtime   February 10, 2010 6 wk followup with PFT' c/w GOLD II copd. Pt states that her breathing has improved a little since last seen. She is also coughing less and now cough is prod with clear sputum. Not taking prilosec before meals but has found doesn't need the neb but twice daily at this point. rec stop smoking, no change rx   May 05, 2010 3 month followup- breathing is the same- no better or worse. No new complaints today. Still smoking. no increase need for rescue, no change doe. rec stop smoking continue ppi ac daily and h2 hs.   July 23, 2010 cc breathing is the same- no better or worse. She c/o nasal congestion and "tickle in throat" x 2 months. rec trial of symbicort and flonase   November 12, 2010 ov Dyspnea and cough- the same no increase need for duoneb. nasal congestion better.  rec Take prednisone 10 mg 4 each am x 2 days, 3 x 2days, 2x2 days, and 1x2 days  2) Continue symbicort 160 2 puffs first thing in am and 2 puffs again in pm about 12 hours later  3) If not satisfied we may need to start back spiriva but if we do you'll need to stop the nebulizer and just use the proaire (albuterol) up to every 4 hours if needed            11/23/2011 f/u ov/Janice Tucker still smoking cc doe housework, does ok room to room doesn't go out more than a couple  times a week due to exhaustion more than short breath. Minimal mucoid sputum production mostly in ams, worse in winter, wants to avoid 02 daytime, uses only hs. rec Try tudorza one puff twice daily taken immediately after the symbicort. You should hear a click and the indicator turn back from green to red between puffs - if not, return here with your inhaler.  While on tudorza it's ok to use the duoneb in the short run but the sign that the tudorza is working is less need for the [employer] would likely qualify for ambulatory 02 but I would strongly recommend first you stop smoking - this will do more for you than any medication or doctor   02/29/2012 f/u ov/Janice Tucker cc sob better on tudorza but insurance needs rider and failed spiriva previously. No purulent sputum, some increased nasal congestion. Excessive use of daytime neb > saba hfa. Still sob walking more than 100 ft Symbicort 160 Take 2 puffs first thing in am and then another 2 puffs about 12 hours later follow each dose with tudorza Think of your respiratory medications as multiple steps you can take to control your symptoms and  avoid having to go to the ER   Plan A is your maintenance daily no matter what meds: (symbicort and tudorza) Plan B only use after you've used your maintenance (Plan A) medication, and only if you can't catch your breath: proaire Plan C only use after you've used plan A and B and still can't catch your breath: add nebulizer  Plan D(for Doctor):  If you've used A thru C and not doing a lot better or still needing C more than a once a day,  D = call the doctor for evaluation asap Plan E (for ER):  If still not able to catch your breath, even after using your nebulizer up to every 4 hours, go to ER  The key is to stop smoking completely before smoking completely stops you!       06/21/2012 f/u ov/Janice Tucker still smoking cc no change Doe x 100 ft and desat to 86% on arrival on ra. No obvious daytime variabilty or assoc  chronic cough or cp or chest tightness, subjective wheeze overt sinus or hb symptoms. No unusual exp hx.   Sleeping ok without nocturnal  or early am exacerbation  of respiratory  c/o's or need for noct saba. Also denies any obvious fluctuation of symptoms with weather or environmental changes or other aggravating or alleviating factors except as outlined above   ROS  At present neg for  any significant sore throat, dysphagia, dental problems, itching, sneezing,    excess/ purulent nasal secretions, ear ache,   fever, chills, sweats, unintended wt loss, pleuritic or exertional cp, hemoptysis, palpitations, orthopnea pnd or leg swelling.  Also denies presyncope, palpitations, heartburn, abdominal pain, anorexia, nausea, vomiting, diarrhea  or change in bowel or urinary habits, change in stools or urine, dysuria,hematuria,  rash, arthralgias, visual complaints, headache, numbness weakness or ataxia or problems with walking or coordination. No noted change in mood/affect or memory.                     Past Medical History:  TOBACCO ABUSE (ICD-305.1)  ATHEROSCLEROTIC CARDIOVASCULAR DISEASE (ICD-429.2)   OSTEOARTHRITIS (ICD-715.90)  ALLERGIC RHINITIS (ICD-477.9)  HYPERLIPIDEMIA (ICD-272.4)  OSTEOPOROSIS (ICD-733.00)  COPD (ICD-496)  - PFT's 01/19/07 FEV1 1.33 (72) with ratio of 48 and DLC0 54%  - PFT/s 2/25/11FEV1 .95(54) ratio 41with DLC0 53%          Objective:   Physical Exam    wt 96 December 31, 2009 > 95 February 10, 2010 > 98 May 05, 2010 > 101 July 23, 2010 > 98 November 12, 2010 > 11/23/2011 100 lb  > 100 02/29/2012 > 06/21/2012 95 HEENT mild turbinate edema. Oropharynx no thrush or excess pnd or cobblestoning. No JVD or cervical adenopathy. Mild accessory muscle hypertrophy. Trachea midline, nl thryroid. Chest was hyperinflated by percussion with diminished breath sounds and marked increased exp time without wheeze. Hoover sign positive onset of inspiration. Regular rate and rhythm  without murmur gallop or rub or increase P2. No edema. Decrease s1s2 Abd: no hsm, nl excursion. Ext warm without cyanosis or clubbing.  Assessment:         Plan:

## 2012-06-21 NOTE — Assessment & Plan Note (Signed)

## 2012-06-21 NOTE — Assessment & Plan Note (Addendum)
-   02 at hs chronically    - Desat to 86% walking 50 feet at ov 09/21/12 and 06/21/2012     - 06/21/2012   Walked 2lpm x one lap @ 185 stopped due to sob with no desat   Start 2lpm with activity outside the house

## 2012-06-21 NOTE — Assessment & Plan Note (Signed)
-   PFT's 01/19/07 FEV1 1.33 (72) with ratio of 48 and DLC0 54%  - PFT/s 12/13/09 FEV1 .95(54) ratio 41with DLC0 53%  - HFA 75% p coaching 02/29/2012   GOLD II with very poor activity tol and desat with activity / still smoking (discussed separately     Each maintenance medication was reviewed in detail including most importantly the difference between maintenance and as needed and under what circumstances the prns are to be used.  Please see instructions for details which were reviewed in writing and the patient given a copy.

## 2012-06-21 NOTE — Patient Instructions (Addendum)
Please see patient coordinator before you leave today  to schedule portable 02 2lp with activity more than one room to next  Symbicort 160 Take 2 puffs first thing in am and then another 2 puffs about 12 hours later follow each dose with tudorza  Think of your respiratory medications as multiple steps you can take to control your symptoms and avoid having to go to the ER   Plan A is your maintenance daily no matter what meds: (symbicort and tudorza) Plan B only use after you've used your maintenance (Plan A) medication, and only if you can't catch your breath: proaire Plan C only use after you've used plan A and B and still can't catch your breath: add nebulizer  Plan D(for Doctor):  If you've used A thru C and not doing a lot better or still needing C more than a once a day,  D = call the doctor for evaluation asap Plan E (for ER):  If still not able to catch your breath, even after using your nebulizer up to every 4 hours, go to ER   The key is to stop smoking completely before smoking completely stops you!    Try atrovent nasal spray in AM to see if it helps drippy nose.   Please schedule a follow up visit in 3 months but call sooner if needed

## 2012-07-04 ENCOUNTER — Telehealth: Payer: Self-pay | Admitting: Internal Medicine

## 2012-07-04 NOTE — Telephone Encounter (Signed)
LMTCB

## 2012-07-05 NOTE — Telephone Encounter (Signed)
LMOMTCB x 1. It looks like the RX has already been sent for the pt.

## 2012-07-06 NOTE — Telephone Encounter (Signed)
lmtcb

## 2012-07-07 NOTE — Telephone Encounter (Signed)
Lm advising we have attempted to reach her an has not heard back from her. Advised to call us back if anything further was needed. Will sign off message per triage protocol

## 2012-07-14 ENCOUNTER — Telehealth: Payer: Self-pay | Admitting: Internal Medicine

## 2012-07-14 NOTE — Telephone Encounter (Signed)
   Called and spoke with patient, she is saying that she talked with [employer] and they told her she already had a portable o2 tank. Patient stated she felt like they were just not on the same page because from her understanding she has a portable tank already that she got "a long time ago" but it is very heavy for her to carry around on her shoulder.  She reports that she is thinking Dr. Sherene Sires is talking about a different kind of portable o2 tank that is much lighter and easier for her to carry around. From how she described the tank to me "it comes not quite to my leg" I explained to her that this seems like one of the smaller portable tanks already.  I also explained that from looking at OV note it looks like the order was placed to get a tank maybe in the thought the you did not already have one. But I told her I would seek clarification just to be sure so that we can get on the same page.  Expressed her appreciation.  Dr. Sherene Sires please clarify if this is correct in what Im thinking, thank you!  OV Note from 06/21/12: Patient Instructions     Please see patient coordinator before you leave today to schedule portable 02 2lp with activity more than one room to next  Symbicort 160 Take 2 puffs first thing in am and then another 2 puffs about 12 hours later follow each dose with tudorza  Think of your respiratory medications as multiple steps you can take to control your symptoms and avoid having to go to the ER

## 2012-07-15 NOTE — Telephone Encounter (Signed)
Spoke to carol at [place] and sh will speak to pt again about a more portable 02 [employer]os

## 2012-07-15 NOTE — Telephone Encounter (Signed)
Will ask Almyra Free to sort this out - I wanted the light portable system not the rolling tank.

## 2012-11-07 ENCOUNTER — Ambulatory Visit (INDEPENDENT_AMBULATORY_CARE_PROVIDER_SITE_OTHER): Payer: Medicare Other | Admitting: Internal Medicine

## 2012-11-07 ENCOUNTER — Encounter: Payer: Self-pay | Admitting: Internal Medicine

## 2012-11-07 VITALS — BP 124/70 | HR 70 | Temp 97.0°F | Ht 63.0 in | Wt 95.6 lb

## 2012-11-07 DIAGNOSIS — J9610 Chronic respiratory failure, unspecified whether with hypoxia or hypercapnia: Secondary | ICD-10-CM

## 2012-11-07 DIAGNOSIS — F17200 Nicotine dependence, unspecified, uncomplicated: Secondary | ICD-10-CM

## 2012-11-07 DIAGNOSIS — J449 Chronic obstructive pulmonary disease, unspecified: Secondary | ICD-10-CM

## 2012-11-07 NOTE — Patient Instructions (Addendum)
Please see patient coordinator before you leave today  to schedule a better portable system for 02   The key is to stop smoking completely before smoking completely stops you!   Please schedule a follow up visit in 3 months but call sooner if needed

## 2012-11-07 NOTE — Progress Notes (Signed)
Subjective:     Patient ID: Janice Tucker, female   DOB: 02/10/1931  MRN: [id number]      Brief patient profile:  81 yowf still smoking but noct 02 dep copd with FEV1 .95 ( 54%) ratio 41 February 10, 2010 c/w GOLD II COPD>   HPI December 31, 2009 cc indolent onset 08/2009 progressive worsening dry mouth and loss of appetite with 30 lb wt loss and now on 02 24 hour per day and only able room to room , mild dry congested cough. rec Prednisone 4 each am x 2days, 2x2days, 1x2days and stop  Duoneb up to 4 x times a day and use proaire as needed  Stop spiriva and advair  Prilosec 20mg  Take one 30-60 min before first meal of the day and pepcid 20 mg one at bedtime   February 10, 2010 6 wk followup with PFT' c/w GOLD II copd. Pt states that her breathing has improved a little since last seen. She is also coughing less and now cough is prod with clear sputum. Not taking prilosec before meals but has found doesn't need the neb but twice daily at this point. rec stop smoking, no change rx   May 05, 2010 3 month followup- breathing is the same- no better or worse. No new complaints today. Still smoking. no increase need for rescue, no change doe. rec stop smoking continue ppi ac daily and h2 hs.   July 23, 2010 cc breathing is the same- no better or worse. She c/o nasal congestion and "tickle in throat" x 2 months. rec trial of symbicort and flonase   November 12, 2010 ov Dyspnea and cough- the same no increase need for duoneb. nasal congestion better.  rec Take prednisone 10 mg 4 each am x 2 days, 3 x 2days, 2x2 days, and 1x2 days  2) Continue symbicort 160 2 puffs first thing in am and 2 puffs again in pm about 12 hours later  3) If not satisfied we may need to start back spiriva but if we do you'll need to stop the nebulizer and just use the proaire (albuterol) up to every 4 hours if needed            11/23/2011 f/u ov/Janice Tucker still smoking cc doe housework, does ok room to room doesn't go out more  than a couple times a week due to exhaustion more than short breath. Minimal mucoid sputum production mostly in ams, worse in winter, wants to avoid 02 daytime, uses only hs. rec Try tudorza one puff twice daily taken immediately after the symbicort. You should hear a click and the indicator turn back from green to red between puffs - if not, return here with your inhaler.  While on tudorza it's ok to use the duoneb in the short run but the sign that the tudorza is working is less need for the [employer] would likely qualify for ambulatory 02 but I would strongly recommend first you stop smoking - this will do more for you than any medication or doctor   02/29/2012 f/u ov/Janice Tucker cc sob better on tudorza but insurance needs rider and failed spiriva previously. No purulent sputum, some increased nasal congestion. Excessive use of daytime neb > saba hfa. Still sob walking more than 100 ft Symbicort 160 Take 2 puffs first thing in am and then another 2 puffs about 12 hours later follow each dose with tudorza Think of your respiratory medications as multiple steps you can take to control  your symptoms and avoid having to go to the ER   Plan A is your maintenance daily no matter what meds: (symbicort and tudorza) Plan B only use after you've used your maintenance (Plan A) medication, and only if you can't catch your breath: proaire Plan C only use after you've used plan A and B and still can't catch your breath: add nebulizer  Plan D(for Doctor):  If you've used A thru C and not doing a lot better or still needing C more than a once a day,  D = call the doctor for evaluation asap Plan E (for ER):  If still not able to catch your breath, even after using your nebulizer up to every 4 hours, go to ER  The key is to stop smoking completely before smoking completely stops you!       06/21/2012 f/u ov/Janice Tucker still smoking cc no change Doe x 100 ft and desat to 86% on arrival on ra.  Please see patient  coordinator before you leave today  to schedule portable 02 2lp with activity more than one room to next No change rx   11/07/2012 f/u ov/Janice Tucker still smoking, not using portable 02, cc no change doe x > slow adls.   No obvious daytime variabilty or assoc chronic cough or cp or chest tightness, subjective wheeze overt sinus or hb symptoms. No unusual exp hx.   Sleeping ok without nocturnal  or early am exacerbation  of respiratory  c/o's or need for noct saba. Also denies any obvious fluctuation of symptoms with weather or environmental changes or other aggravating or alleviating factors except as outlined above   ROS  The following are not active complaints unless bolded sore throat, dysphagia, dental problems, itching, sneezing,  nasal congestion or excess/ purulent secretions, ear ache,   fever, chills, sweats, unintended wt loss, pleuritic or exertional cp, hemoptysis,  orthopnea pnd or leg swelling, presyncope, palpitations, heartburn, abdominal pain, anorexia, nausea, vomiting, diarrhea  or change in bowel or urinary habits, change in stools or urine, dysuria,hematuria,  rash, arthralgias, visual complaints, headache, numbness weakness or ataxia or problems with walking or coordination,  change in mood/affect or memory.                         Past Medical History:  TOBACCO ABUSE (ICD-305.1)  ATHEROSCLEROTIC CARDIOVASCULAR DISEASE (ICD-429.2)   OSTEOARTHRITIS (ICD-715.90)  ALLERGIC RHINITIS (ICD-477.9)  HYPERLIPIDEMIA (ICD-272.4)  OSTEOPOROSIS (ICD-733.00)  COPD (ICD-496)  - PFT's 01/19/07 FEV1 1.33 (72) with ratio of 48 and DLC0 54%  - PFT/s 2/25/11FEV1 .95(54) ratio 41with DLC0 53%          Objective:   Physical Exam    wt 96 December 31, 2009 > 95 February 10, 2010 > 98 May 05, 2010 > 101 July 23, 2010 > 98 November 12, 2010 > 11/23/2011 100 lb  > 100 02/29/2012 > 06/21/2012 95 > 11/07/2012 95  HEENT mild turbinate edema. Oropharynx no thrush or excess pnd or cobblestoning. No  JVD or cervical adenopathy. Mild accessory muscle hypertrophy. Trachea midline, nl thryroid. Chest was hyperinflated by percussion with diminished breath sounds and marked increased exp time without wheeze. Hoover sign positive onset of inspiration. Regular rate and rhythm without murmur gallop or rub or increase P2. No edema. Decrease s1s2 Abd: no hsm, nl excursion. Ext warm without cyanosis or clubbing.  Assessment:         Plan:

## 2012-11-13 NOTE — Assessment & Plan Note (Signed)
>   3 min I reviewed the Flethcher curve with patient that basically indicates  if you quit smoking when your best day FEV1 is still well preserved it is highly unlikely you will progress to severe disease and informed the patient there was no medication on the market that has proven to change the curve or the likelihood of progression.  Therefore stopping smoking and maintaining abstinence is the most important aspect of care, not choice of inhalers or for that matter, doctors.   

## 2012-11-13 NOTE — Assessment & Plan Note (Signed)
-   PFT's 01/19/07 FEV1 1.33 (72) with ratio of 48 and DLC0 54%  - PFT/s 12/13/09 FEV1 .95(54) ratio 41with DLC0 53%  - HFA 75% p coaching 02/29/2012   GOLD II and still smoking, discussed separately    Each maintenance medication was reviewed in detail including most importantly the difference between maintenance and as needed and under what circumstances the prns are to be used.  Please see instructions for details which were reviewed in writing and the patient given a copy.

## 2012-11-13 NOTE — Assessment & Plan Note (Signed)
-   02 at hs chronically    - Desat to 86% walking 50 feet at ov 09/21/12 and 06/21/2012     - 06/21/2012   Walked 2lpm x one lap @ 185 stopped due to sob with no desat   Adequate control on present rx, reviewed

## 2013-02-01 ENCOUNTER — Telehealth: Payer: Self-pay | Admitting: Internal Medicine

## 2013-02-01 NOTE — Telephone Encounter (Signed)
Attempted to call pt x's 3 to make next ov per recall.  PT never returned calls.  Mailed recall letter 02/01/13. Emily E McAlister °

## 2013-02-16 ENCOUNTER — Other Ambulatory Visit: Payer: Self-pay | Admitting: Internal Medicine

## 2013-02-17 ENCOUNTER — Ambulatory Visit: Payer: Medicare Other | Admitting: Internal Medicine

## 2013-02-21 ENCOUNTER — Ambulatory Visit (INDEPENDENT_AMBULATORY_CARE_PROVIDER_SITE_OTHER): Payer: Medicare Other | Admitting: Internal Medicine

## 2013-02-21 ENCOUNTER — Encounter: Payer: Self-pay | Admitting: Internal Medicine

## 2013-02-21 VITALS — BP 110/62 | HR 78 | Temp 97.4°F | Ht 62.0 in | Wt 92.8 lb

## 2013-02-21 DIAGNOSIS — J9610 Chronic respiratory failure, unspecified whether with hypoxia or hypercapnia: Secondary | ICD-10-CM

## 2013-02-21 DIAGNOSIS — F17200 Nicotine dependence, unspecified, uncomplicated: Secondary | ICD-10-CM

## 2013-02-21 DIAGNOSIS — J449 Chronic obstructive pulmonary disease, unspecified: Secondary | ICD-10-CM

## 2013-02-21 NOTE — Progress Notes (Signed)
Subjective:     Patient ID: Janice Tucker, female   DOB: 09-02-1931  MRN: [id number]      Brief patient profile:  81 yowf still smoking but noct 02 dep copd with FEV1 .95 ( 54%) ratio 41 February 10, 2010 c/w GOLD II COPD>   HPI December 31, 2009 cc indolent onset 08/2009 progressive worsening dry mouth and loss of appetite with 30 lb wt loss and now on 02 24 hour per day and only able room to room , mild dry congested cough. rec Prednisone 4 each am x 2days, 2x2days, 1x2days and stop  Duoneb up to 4 x times a day and use proaire as needed  Stop spiriva and advair  Prilosec 20mg  Take one 30-60 min before first meal of the day and pepcid 20 mg one at bedtime   February 10, 2010 6 wk followup with PFT' c/w GOLD II copd. Pt states that her breathing has improved a little since last seen. She is also coughing less and now cough is prod with clear sputum. Not taking prilosec before meals but has found doesn't need the neb but twice daily at this point. rec stop smoking, no change rx   May 05, 2010 3 month followup- breathing is the same- no better or worse. No new complaints today. Still smoking. no increase need for rescue, no change doe. rec stop smoking continue ppi ac daily and h2 hs.   July 23, 2010 cc breathing is the same- no better or worse. She c/o nasal congestion and "tickle in throat" x 2 months. rec trial of symbicort and flonase   November 12, 2010 ov Dyspnea and cough- the same no increase need for duoneb. nasal congestion better.  rec Take prednisone 10 mg 4 each am x 2 days, 3 x 2days, 2x2 days, and 1x2 days  2) Continue symbicort 160 2 puffs first thing in am and 2 puffs again in pm about 12 hours later  3) If not satisfied we may need to start back spiriva but if we do you'll need to stop the nebulizer and just use the proaire (albuterol) up to every 4 hours if needed            11/23/2011 f/u ov/Janice Tucker still smoking cc doe housework, does ok room to room doesn't go out more  than a couple times a week due to exhaustion more than short breath. Minimal mucoid sputum production mostly in ams, worse in winter, wants to avoid 02 daytime, uses only hs. rec Try tudorza one puff twice daily taken immediately after the symbicort. You should hear a click and the indicator turn back from green to red between puffs - if not, return here with your inhaler.  While on tudorza it's ok to use the duoneb in the short run but the sign that the tudorza is working is less need for the [employer] would likely qualify for ambulatory 02 but I would strongly recommend first you stop smoking - this will do more for you than any medication or doctor   02/29/2012 f/u ov/Janice Tucker cc sob better on tudorza but insurance needs rider and failed spiriva previously. No purulent sputum, some increased nasal congestion. Excessive use of daytime neb > saba hfa. Still sob walking more than 100 ft Symbicort 160 Take 2 puffs first thing in am and then another 2 puffs about 12 hours later follow each dose with tudorza Think of your respiratory medications as multiple steps you can take to control  your symptoms and avoid having to go to the ER   Plan A is your maintenance daily no matter what meds: (symbicort and tudorza) Plan B only use after you've used your maintenance (Plan A) medication, and only if you can't catch your breath: proaire Plan C only use after you've used plan A and B and still can't catch your breath: add nebulizer  Plan D(for Doctor):  If you've used A thru C and not doing a lot better or still needing C more than a once a day,  D = call the doctor for evaluation asap Plan E (for ER):  If still not able to catch your breath, even after using your nebulizer up to every 4 hours, go to ER  The key is to stop smoking completely before smoking completely stops you!       06/21/2012 f/u ov/Janice Tucker still smoking cc no change Doe x 100 ft and desat to 86% on arrival on ra.  Please see patient  coordinator before you leave today  to schedule portable 02 2lp with activity more than one room to next No change rx   11/07/2012 f/u ov/Janice Tucker still smoking, not using portable 02, cc no change doe x > slow adls  rec Please see patient coordinator before you leave today  to schedule a better portable system for 02  The key is to stop smoking completely before smoking completely stops you!    02/21/2013 f/u ov/Janice Tucker  still smoking f/u copd Chief Complaint  Patient presents with  . COPD    pt states having increase sob,due to allergies. productive cough.    worse x several weeks, not wearing 02 with ex as can't use shoulder 02 assoc with min prod cough, thick white mucus less than a tbsp, some worse in am, some better p saba    No obvious daytime variabilty or assoc chronic cough or cp or chest tightness, subjective wheeze overt sinus or hb symptoms. No unusual exp hx.   Sleeping ok without nocturnal  or early am exacerbation  of respiratory  c/o's or need for noct saba. Also denies any obvious fluctuation of symptoms with weather or environmental changes or other aggravating or alleviating factors except as outlined above   Current Medications, Allergies, Past Medical History, Past Surgical History, Family History, and Social History were reviewed in [employer] record.  ROS  The following are not active complaints unless bolded sore throat, dysphagia, dental problems, itching, sneezing,  nasal congestion or excess/ purulent secretions, ear ache,   fever, chills, sweats, unintended wt loss, pleuritic or exertional cp, hemoptysis,  orthopnea pnd or leg swelling, presyncope, palpitations, heartburn, abdominal pain, anorexia, nausea, vomiting, diarrhea  or change in bowel or urinary habits, change in stools or urine, dysuria,hematuria,  rash, arthralgias, visual complaints, headache, numbness weakness or ataxia or problems with walking or coordination,  change in mood/affect or  memory.                           Past Medical History:  TOBACCO ABUSE (ICD-305.1)  ATHEROSCLEROTIC CARDIOVASCULAR DISEASE (ICD-429.2)   OSTEOARTHRITIS (ICD-715.90)  ALLERGIC RHINITIS (ICD-477.9)  HYPERLIPIDEMIA (ICD-272.4)  OSTEOPOROSIS (ICD-733.00)  COPD (ICD-496)  - PFT's 01/19/07 FEV1 1.33 (72) with ratio of 48 and DLC0 54%  - PFT/s 2/25/11FEV1 .95(54) ratio 41with DLC0 53%          Objective:   Physical Exam    wt 96 December 31, 2009 >  95 February 10, 2010 > 98 May 05, 2010 > 101 July 23, 2010 > 98 November 12, 2010 > 11/23/2011 100 lb  > 100 02/29/2012 > 06/21/2012 95 > 11/07/2012 95 >  92 02/21/2013  HEENT mild turbinate edema. Oropharynx no thrush or excess pnd or cobblestoning. No JVD or cervical adenopathy. Mild accessory muscle hypertrophy. Trachea midline, nl thryroid. Chest was hyperinflated by percussion with diminished breath sounds and marked increased exp time without wheeze. Hoover sign positive onset of inspiration. Regular rate and rhythm without murmur gallop or rub or increase P2. No edema. Decrease s1s2 Abd: no hsm, nl excursion. Ext warm without cyanosis or clubbing.  Assessment:         Plan:

## 2013-02-21 NOTE — Patient Instructions (Addendum)
Call [facility] at [phone] for any issues related to your need for portable 02 at 2lpm walking outside the house  Please schedule a follow up visit in 3 months but call sooner if needed

## 2013-02-22 MED ORDER — BUDESONIDE-FORMOTEROL FUMARATE 160-4.5 MCG/ACT IN AERO: 2.0000 | INHALATION_SPRAY | Freq: Two times a day (BID) | RESPIRATORY_TRACT | Status: DC

## 2013-02-22 NOTE — Assessment & Plan Note (Signed)

## 2013-02-22 NOTE — Assessment & Plan Note (Signed)
-   02 at hs chronically    - Desat to 86% walking 50 feet at ov 09/21/12 and 06/21/2012     - 06/21/2012   Walked 2lpm x one lap @ 185 stopped due to sob with no desat   Reviewed use of 02 always when walking outside of house

## 2013-02-22 NOTE — Assessment & Plan Note (Addendum)
-   PFT's 01/19/07 FEV1 1.33 (72) with ratio of 48 and DLC0 54%  - PFT/s 12/13/09 FEV1 .95(54) ratio 41with DLC0 53%  - HFA 75% p coaching  02/21/2013   GOLD II with poorly controlled symptoms  DDX of  difficult airways managment all start with A and  include Adherence, Ace Inhibitors, Acid Reflux, Active Sinus Disease, Alpha 1 Antitripsin deficiency, Anxiety masquerading as Airways dz,  ABPA,  allergy(esp in young), Aspiration (esp in elderly), Adverse effects of DPI,  Active smokers, plus two Bs  = Bronchiectasis and Beta blocker use..and one C= CHF  The proper method of use, as well as anticipated side effects, of a metered-dose inhaler are discussed and demonstrated to the patient. Improved effectiveness after extensive coaching during this visit to a level of approximately  75%   Active smoking discussed separately

## 2013-05-17 ENCOUNTER — Telehealth: Payer: Self-pay | Admitting: Internal Medicine

## 2013-05-17 NOTE — Telephone Encounter (Signed)
Called patient x3 and lm for return appointment. No return call back. Sent letter 05/17/13. °

## 2013-05-25 ENCOUNTER — Ambulatory Visit (INDEPENDENT_AMBULATORY_CARE_PROVIDER_SITE_OTHER): Payer: Medicare Other | Admitting: Internal Medicine

## 2013-05-25 ENCOUNTER — Encounter: Payer: Self-pay | Admitting: Internal Medicine

## 2013-05-25 VITALS — BP 130/70 | HR 86 | Temp 98.1°F | Ht 62.0 in | Wt 93.8 lb

## 2013-05-25 DIAGNOSIS — F17200 Nicotine dependence, unspecified, uncomplicated: Secondary | ICD-10-CM

## 2013-05-25 DIAGNOSIS — J449 Chronic obstructive pulmonary disease, unspecified: Secondary | ICD-10-CM

## 2013-05-25 DIAGNOSIS — J9610 Chronic respiratory failure, unspecified whether with hypoxia or hypercapnia: Secondary | ICD-10-CM

## 2013-05-25 NOTE — Progress Notes (Signed)
Subjective:     Patient ID: Janice Tucker, female   DOB: 1931-03-15  MRN: [id number]      Brief patient profile:  81 yowf still smoking but noct 02 dep copd with FEV1 .95 ( 54%) ratio 41 February 10, 2010 c/w GOLD II COPD>   HPI December 31, 2009 cc indolent onset 08/2009 progressive worsening dry mouth and loss of appetite with 30 lb wt loss and now on 02 24 hour per day and only able room to room , mild dry congested cough. rec Prednisone 4 each am x 2days, 2x2days, 1x2days and stop  Duoneb up to 4 x times a day and use proaire as needed  Stop spiriva and advair  Prilosec 20mg  Take one 30-60 min before first meal of the day and pepcid 20 mg one at bedtime   February 10, 2010 6 wk followup with PFT' c/w GOLD II copd. Pt states that her breathing has improved a little since last seen. She is also coughing less and now cough is prod with clear sputum. Not taking prilosec before meals but has found doesn't need the neb but twice daily at this point. rec stop smoking, no change rx   May 05, 2010 3 month followup- breathing is the same- no better or worse. No new complaints today. Still smoking. no increase need for rescue, no change doe. rec stop smoking continue ppi ac daily and h2 hs.   July 23, 2010 cc breathing is the same- no better or worse. She c/o nasal congestion and "tickle in throat" x 2 months. rec trial of symbicort and flonase   November 12, 2010 ov Dyspnea and cough- the same no increase need for duoneb. nasal congestion better.  rec Take prednisone 10 mg 4 each am x 2 days, 3 x 2days, 2x2 days, and 1x2 days  2) Continue symbicort 160 2 puffs first thing in am and 2 puffs again in pm about 12 hours later  3) If not satisfied we may need to start back spiriva but if we do you'll need to stop the nebulizer and just use the proaire (albuterol) up to every 4 hours if needed            11/23/2011 f/u ov/Rhyland Hinderliter still smoking cc doe housework, does ok room to room doesn't go out more  than a couple times a week due to exhaustion more than short breath. Minimal mucoid sputum production mostly in ams, worse in winter, wants to avoid 02 daytime, uses only hs. rec Try tudorza one puff twice daily taken immediately after the symbicort. You should hear a click and the indicator turn back from green to red between puffs - if not, return here with your inhaler.  While on tudorza it's ok to use the duoneb in the short run but the sign that the tudorza is working is less need for the [employer] would likely qualify for ambulatory 02 but I would strongly recommend first you stop smoking - this will do more for you than any medication or doctor   02/29/2012 f/u ov/Masyn Rostro cc sob better on tudorza but insurance needs rider and failed spiriva previously. No purulent sputum, some increased nasal congestion. Excessive use of daytime neb > saba hfa. Still sob walking more than 100 ft Symbicort 160 Take 2 puffs first thing in am and then another 2 puffs about 12 hours later follow each dose with tudorza Think of your respiratory medications as multiple steps you can take to control  your symptoms and avoid having to go to the ER   Plan A is your maintenance daily no matter what meds: (symbicort and tudorza) Plan B only use after you've used your maintenance (Plan A) medication, and only if you can't catch your breath: proaire Plan C only use after you've used plan A and B and still can't catch your breath: add nebulizer  Plan D(for Doctor):  If you've used A thru C and not doing a lot better or still needing C more than a once a day,  D = call the doctor for evaluation asap Plan E (for ER):  If still not able to catch your breath, even after using your nebulizer up to every 4 hours, go to ER  The key is to stop smoking completely before smoking completely stops you!       06/21/2012 f/u ov/Veryl Abril still smoking cc no change Doe x 100 ft and desat to 86% on arrival on ra.  Please see patient  coordinator before you leave today  to schedule portable 02 2lp with activity more than one room to next No change rx   11/07/2012 f/u ov/Janziel Hockett still smoking, not using portable 02, cc no change doe x > slow adls  rec Please see patient coordinator before you leave today  to schedule a better portable system for 02  The key is to stop smoking completely before smoking completely stops you!    02/21/2013 f/u ov/Kenta Laster  still smoking f/u copd Chief Complaint  Patient presents with  . COPD    pt states having increase sob,due to allergies. productive cough.    worse x several weeks, not wearing 02 with ex as can't use shoulder 02 assoc with min prod cough, thick white mucus less than a tbsp, some worse in am, some better p saba rec Call [facility] at [phone] for any issues related to your need for portable 02 at 2lpm walking outside the house   05/25/2013 f/u ov/Danely Bayliss re COPD/ still smoking/ not using 02 Chief Complaint  Patient presents with  . Follow-up    Pt states SOB and cough are unchanged since her last visit. She denies any new co's today.    saba 1 -2 x daily does help some, mostly day not at night, not using portable 02 as per instructions, doesn't go out much because afrair she'll run out of breath.  Min congested cough mostly in ams white mucus    No obvious daytime variabilty or assoc chronic cough or cp or chest tightness, subjective wheeze overt sinus or hb symptoms. No unusual exp hx.   Sleeping ok without nocturnal  or early am exacerbation  of respiratory  c/o's or need for noct saba. Also denies any obvious fluctuation of symptoms with weather or environmental changes or other aggravating or alleviating factors except as outlined above   Current Medications, Allergies, Past Medical History, Past Surgical History, Family History, and Social History were reviewed in [employer] record.  ROS  The following are not active complaints unless bolded sore throat,  dysphagia, dental problems, itching, sneezing,  nasal congestion or excess/ purulent secretions, ear ache,   fever, chills, sweats, unintended wt loss, pleuritic or exertional cp, hemoptysis,  orthopnea pnd or leg swelling, presyncope, palpitations, heartburn, abdominal pain, anorexia, nausea, vomiting, diarrhea  or change in bowel or urinary habits, change in stools or urine, dysuria,hematuria,  rash, arthralgias, visual complaints, headache, numbness weakness or ataxia or problems with walking or coordination,  change  in mood/affect or memory.            Past Medical History:  TOBACCO ABUSE (ICD-305.1)  ATHEROSCLEROTIC CARDIOVASCULAR DISEASE (ICD-429.2)   OSTEOARTHRITIS (ICD-715.90)  ALLERGIC RHINITIS (ICD-477.9)  HYPERLIPIDEMIA (ICD-272.4)  OSTEOPOROSIS (ICD-733.00)  COPD (ICD-496)  - PFT's 01/19/07 FEV1 1.33 (72) with ratio of 48 and DLC0 54%  - PFT/s 2/25/11FEV1 .95(54) ratio 41with DLC0 53%          Objective:   Physical Exam  Wt 96 December 31, 2009 > 95 February 10, 2010 > 98 May 05, 2010 > 101 July 23, 2010 > 98 November 12, 2010 > 11/23/2011 100 lb  > 100 02/29/2012 > 06/21/2012 95 > 11/07/2012 95 >  92 02/21/2013 > 05/25/2013  93  HEENT mild turbinate edema. Oropharynx no thrush or excess pnd or cobblestoning. No JVD or cervical adenopathy. Mild accessory muscle hypertrophy. Trachea midline, nl thryroid. Chest was hyperinflated by percussion with diminished breath sounds and marked increased exp time without wheeze. Hoover sign positive onset of inspiration. Regular rate and rhythm without murmur gallop or rub or increase P2. No edema. Decrease s1s2 Abd: no hsm, nl excursion. Ext warm without cyanosis or clubbing.       Assessment:

## 2013-05-25 NOTE — Patient Instructions (Addendum)
02 is 2lpm at sleep and with exertion if needed    Please schedule a follow up visit in 3 months but call sooner if needed

## 2013-05-27 NOTE — Assessment & Plan Note (Signed)
-   02 at hs chronically    - Desat to 86% walking 50 feet at ov 09/21/12 and 06/21/2012   - 06/21/2012   Walked 2lpm x one lap @ 185 stopped due to sob with no desat   - 05/25/2013  Walked RA  2 laps @ 185 ft each stopped due to  Sob, no desat  No 02 needed at this point daytime.

## 2013-05-27 NOTE — Assessment & Plan Note (Signed)
>   3 min  I reviewed the Flethcher curve with patient that basically indicates  if you quit smoking when your best day FEV1 is still well preserved ( which hers actually still is)  it is highly unlikely you will progress to severe disease and informed the patient there was no medication on the market that has proven to change the curve or the likelihood of progression.  Therefore stopping smoking and maintaining abstinence is the most important aspect of care, not choice of inhalers or for that matter, doctors.

## 2013-05-27 NOTE — Assessment & Plan Note (Addendum)
-   PFT's 01/19/07 FEV1 1.33 (72) with ratio of 48 and DLC0 54%  - PFT/s 12/13/09 FEV1 .95(54) ratio 41with DLC0 53%   The proper method of use, as well as anticipated side effects, of a metered-dose inhaler are discussed and demonstrated to the patient. Improved effectiveness after extensive coaching during this visit to a level of approximately  90%  Nothing else to offer here x stop smoking (discussed separately)    Each maintenance medication was reviewed in detail including most importantly the difference between maintenance and as needed and under what circumstances the prns are to be used.  Please see instructions for details which were reviewed in writing and the patient given a copy.

## 2013-07-24 ENCOUNTER — Other Ambulatory Visit: Payer: Self-pay | Admitting: Internal Medicine

## 2013-09-06 ENCOUNTER — Ambulatory Visit: Payer: Medicare Other | Admitting: Internal Medicine

## 2013-09-11 ENCOUNTER — Ambulatory Visit: Payer: Medicare Other | Admitting: Internal Medicine

## 2013-09-21 ENCOUNTER — Ambulatory Visit (INDEPENDENT_AMBULATORY_CARE_PROVIDER_SITE_OTHER): Payer: Medicare Other | Admitting: Internal Medicine

## 2013-09-21 ENCOUNTER — Encounter: Payer: Self-pay | Admitting: Internal Medicine

## 2013-09-21 VITALS — BP 150/80 | HR 75 | Temp 97.5°F | Ht 61.0 in | Wt 93.0 lb

## 2013-09-21 DIAGNOSIS — J449 Chronic obstructive pulmonary disease, unspecified: Secondary | ICD-10-CM

## 2013-09-21 DIAGNOSIS — J9610 Chronic respiratory failure, unspecified whether with hypoxia or hypercapnia: Secondary | ICD-10-CM

## 2013-09-21 DIAGNOSIS — F17200 Nicotine dependence, unspecified, uncomplicated: Secondary | ICD-10-CM

## 2013-09-21 NOTE — Assessment & Plan Note (Signed)
>   3 m  I took an extended  opportunity with this patient to outline the consequences of continued cigarette use  in airway disorders based on all the data we have from the multiple national lung health studies (perfomed over decades at millions of dollars in cost)  indicating that smoking cessation, not choice of inhalers or physicians, is the most important aspect of care.   

## 2013-09-21 NOTE — Assessment & Plan Note (Signed)
-   02 at hs chronically    - Desat to 86% walking 50 feet at ov 09/21/12 and 06/21/2012   - 06/21/2012   Walked 2lpm x one lap @ 185 stopped due to sob with no desat   - 05/25/2013  Walked RA  2 laps @ 185 ft each stopped due to  Sob, no desat  No need for 02 x at hs at this point

## 2013-09-21 NOTE — Patient Instructions (Addendum)
The key is to stop smoking completely before smoking completely stops you!   Please schedule a follow up visit in 3 months but call sooner if needed with PFT's

## 2013-09-21 NOTE — Progress Notes (Signed)
Subjective:     Patient ID: Janice Tucker, female   DOB: 28-Dec-1930  MRN: [id number]      Brief patient profile:  8 yowf still smoking but noct 02 dep copd with FEV1 .95 ( 54%) ratio 41 February 10, 2010 c/w GOLD II COPD.   HPI December 31, 2009 cc indolent onset 08/2009 progressive worsening dry mouth and loss of appetite with 30 lb wt loss and now on 02 24 hour per day and only able room to room , mild dry congested cough. rec Prednisone 4 each am x 2days, 2x2days, 1x2days and stop  Duoneb up to 4 x times a day and use proaire as needed  Stop spiriva and advair  Prilosec 20mg  Take one 30-60 min before first meal of the day and pepcid 20 mg one at bedtime   February 10, 2010 6 wk followup with PFT' c/w GOLD II copd. Pt states that her breathing has improved a little since last seen. She is also coughing less and now cough is prod with clear sputum. Not taking prilosec before meals but has found doesn't need the neb but twice daily at this point. rec stop smoking, no change rx   May 05, 2010 3 month followup- breathing is the same- no better or worse. No new complaints today. Still smoking. no increase need for rescue, no change doe. rec stop smoking continue ppi ac daily and h2 hs.   July 23, 2010 cc breathing is the same- no better or worse. She c/o nasal congestion and "tickle in throat" x 2 months. rec trial of symbicort and flonase   November 12, 2010 ov Dyspnea and cough- the same no increase need for duoneb. nasal congestion better.  rec Take prednisone 10 mg 4 each am x 2 days, 3 x 2days, 2x2 days, and 1x2 days  2) Continue symbicort 160 2 puffs first thing in am and 2 puffs again in pm about 12 hours later  3) If not satisfied we may need to start back spiriva but if we do you'll need to stop the nebulizer and just use the proaire (albuterol) up to every 4 hours if needed            11/23/2011 f/u ov/Janice Tucker still smoking cc doe housework, does ok room to room doesn't go out more  than a couple times a week due to exhaustion more than short breath. Minimal mucoid sputum production mostly in ams, worse in winter, wants to avoid 02 daytime, uses only hs. rec Try tudorza one puff twice daily taken immediately after the symbicort. You should hear a click and the indicator turn back from green to red between puffs - if not, return here with your inhaler.  While on tudorza it's ok to use the duoneb in the short run but the sign that the tudorza is working is less need for the [employer] would likely qualify for ambulatory 02 but I would strongly recommend first you stop smoking - this will do more for you than any medication or doctor   02/29/2012 f/u ov/Janice Tucker cc sob better on tudorza but insurance needs rider and failed spiriva previously. No purulent sputum, some increased nasal congestion. Excessive use of daytime neb > saba hfa. Still sob walking more than 100 ft Symbicort 160 Take 2 puffs first thing in am and then another 2 puffs about 12 hours later follow each dose with tudorza Think of your respiratory medications as multiple steps you can take to control  your symptoms and avoid having to go to the ER   Plan A is your maintenance daily no matter what meds: (symbicort and tudorza) Plan B only use after you've used your maintenance (Plan A) medication, and only if you can't catch your breath: proaire Plan C only use after you've used plan A and B and still can't catch your breath: add nebulizer  Plan D(for Doctor):  If you've used A thru C and not doing a lot better or still needing C more than a once a day,  D = call the doctor for evaluation asap Plan E (for ER):  If still not able to catch your breath, even after using your nebulizer up to every 4 hours, go to ER  The key is to stop smoking completely before smoking completely stops you!        05/25/2013 f/u ov/Janice Tucker re COPD/ still smoking/ not using 02 Chief Complaint  Patient presents with  . Follow-up    Pt  states SOB and cough are unchanged since her last visit. She denies any new co's today.   saba 1 -2 x daily does help some, mostly day not at night, not using portable 02 as per instructions, doesn't go out much because afrair she'll run out of breath.  Min congested cough mostly in ams white mucus  rec No change rx   09/21/2013 f/u ov/Janice Tucker re: still smoking, not using portable 02 system Chief Complaint  Patient presents with  . Follow-up    Breathing is unchanged. Still smoking 1.5 ppd.   rarely needing saba more than 2 x daily.  No obvious daytime variabilty or assoc chronic cough or cp or chest tightness, subjective wheeze overt sinus or hb symptoms. No unusual exp hx.   Sleeping ok without nocturnal  or early am exacerbation  of respiratory  c/o's or need for noct saba. Also denies any obvious fluctuation of symptoms with weather or environmental changes or other aggravating or alleviating factors except as outlined above   Current Medications, Allergies, Past Medical History, Past Surgical History, Family History, and Social History were reviewed in [employer] record.  ROS  The following are not active complaints unless bolded sore throat, dysphagia, dental problems, itching, sneezing,  nasal congestion or excess/ purulent secretions, ear ache,   fever, chills, sweats, unintended wt loss, pleuritic or exertional cp, hemoptysis,  orthopnea pnd or leg swelling, presyncope, palpitations, heartburn, abdominal pain, anorexia, nausea, vomiting, diarrhea  or change in bowel or urinary habits, change in stools or urine, dysuria,hematuria,  rash, arthralgias, visual complaints, headache, numbness weakness or ataxia or problems with walking or coordination,  change in mood/affect or memory.            Past Medical History:  TOBACCO ABUSE (ICD-305.1)  ATHEROSCLEROTIC CARDIOVASCULAR DISEASE (ICD-429.2)   OSTEOARTHRITIS (ICD-715.90)  ALLERGIC RHINITIS (ICD-477.9)   HYPERLIPIDEMIA (ICD-272.4)  OSTEOPOROSIS (ICD-733.00)  COPD (ICD-496)  - PFT's 01/19/07 FEV1 1.33 (72) with ratio of 48 and DLC0 54%  - PFT/s 2/25/11FEV1 .95(54) ratio 41with DLC0 53%          Objective:   Physical Exam  Wt 96 December 31, 2009 > 95 February 10, 2010 > 98 May 05, 2010 > 101 July 23, 2010 > 98 November 12, 2010 > 11/23/2011 100 lb  > 100 02/29/2012 > 06/21/2012 95 > 11/07/2012 95 >  92 02/21/2013 > 05/25/2013  93  > 09/21/2013  93  HEENT mild turbinate edema. Oropharynx no thrush or excess  pnd or cobblestoning. No JVD or cervical adenopathy. Mild accessory muscle hypertrophy. Trachea midline, nl thryroid. Chest was hyperinflated by percussion with diminished breath sounds and marked increased exp time without wheeze. Hoover sign positive onset of inspiration. Regular rate and rhythm without murmur gallop or rub or increase P2. No edema. Decrease s1s2 Abd: no hsm, nl excursion. Ext warm without cyanosis or clubbing.     cxr 02/10/10 COPD without acute finding.   Assessment:

## 2013-09-21 NOTE — Assessment & Plan Note (Signed)
-   PFT's 01/19/07 FEV1 1.33 (72%) with ratio of 48 and DLC0 54%  - PFT/s 12/13/09 FEV1 .95(54%) ratio 41with DLC0 53%  - HFA 90% 05/25/2013   No doubt she's progressed to GOLD III by now as she has continued to smoke   No change in rx needed at this point > f/u pft's in 2015 recommended.  See instructions for specific recommendations which were reviewed directly with the patient who was given a copy with highlighter outlining the key components.

## 2014-01-17 ENCOUNTER — Other Ambulatory Visit: Payer: Self-pay | Admitting: Internal Medicine

## 2014-01-17 DIAGNOSIS — J449 Chronic obstructive pulmonary disease, unspecified: Secondary | ICD-10-CM

## 2014-02-27 ENCOUNTER — Ambulatory Visit (INDEPENDENT_AMBULATORY_CARE_PROVIDER_SITE_OTHER): Payer: Medicare HMO | Admitting: Internal Medicine

## 2014-02-27 ENCOUNTER — Encounter: Payer: Self-pay | Admitting: Internal Medicine

## 2014-02-27 VITALS — BP 122/70 | HR 91 | Temp 97.8°F | Ht 61.25 in | Wt 90.5 lb

## 2014-02-27 DIAGNOSIS — J449 Chronic obstructive pulmonary disease, unspecified: Secondary | ICD-10-CM

## 2014-02-27 DIAGNOSIS — J9610 Chronic respiratory failure, unspecified whether with hypoxia or hypercapnia: Secondary | ICD-10-CM

## 2014-02-27 DIAGNOSIS — F17200 Nicotine dependence, unspecified, uncomplicated: Secondary | ICD-10-CM

## 2014-02-27 NOTE — Progress Notes (Signed)
PFT done today. 

## 2014-02-27 NOTE — Patient Instructions (Signed)
The key is to stop smoking completely before smoking completely stops you!    If you are satisfied with your treatment plan let your doctor know and he/she can either refill your medications or you can return here when your prescription runs out.     If in any way you are not 100% satisfied,  please tell us.  If 100% better, tell your friends!

## 2014-02-28 ENCOUNTER — Other Ambulatory Visit: Payer: Self-pay | Admitting: Internal Medicine

## 2014-02-28 NOTE — Assessment & Plan Note (Signed)
-   02 2.5 lpm  at hs chronically    - Desat to 86% walking 50 feet at ov 09/21/12 and 06/21/2012   - 06/21/2012   Walked 2lpm x one lap @ 185 stopped due to sob with no desat   - 05/25/2013  Walked RA  2 laps @ 185 ft each stopped due to  Sob, no desat  Declined repeat walking study today, says she checks sats walking and they are fine

## 2014-02-28 NOTE — Assessment & Plan Note (Signed)

## 2014-02-28 NOTE — Assessment & Plan Note (Signed)
-   PFT's 01/19/07 FEV1 1.33 (72%) with ratio of 48 and DLC0 54%  - PFT/s 12/13/09 FEV1 .95(54%) ratio 41with DLC0 53% - PFTs 02/27/2014  FEV1  0.83 (49%) ratio 46 with 15% resp to B2 despite laba/ics/lama prior and DLCO 49 and 68%   I had an extended summary  discussion with the patient today lasting 15 to 20 minutes of a 25 minute visit on the following issues:  As per the [place], there has been progressive decline in fev1 x 7 years that can only be changed by d/c cigs/ not meds or doctors.  See smoking sep a/p    Each maintenance medication was reviewed in detail including most importantly the difference between maintenance and as needed and under what circumstances the prns are to be used.  Please see instructions for details which were reviewed in writing and the patient given a copy.    Nothing else to offer through this clinic, f/u will be prn

## 2014-02-28 NOTE — Progress Notes (Addendum)
Subjective:     Patient ID: Janice Tucker, female   DOB: 28-Dec-1930  MRN: [id number]      Brief patient profile:  8 yowf still smoking but noct 02 dep copd with FEV1 .95 ( 54%) ratio 41 February 10, 2010 c/w GOLD II COPD.   HPI December 31, 2009 cc indolent onset 08/2009 progressive worsening dry mouth and loss of appetite with 30 lb wt loss and now on 02 24 hour per day and only able room to room , mild dry congested cough. rec Prednisone 4 each am x 2days, 2x2days, 1x2days and stop  Duoneb up to 4 x times a day and use proaire as needed  Stop spiriva and advair  Prilosec 20mg  Take one 30-60 min before first meal of the day and pepcid 20 mg one at bedtime   February 10, 2010 6 wk followup with PFT' c/w GOLD II copd. Pt states that her breathing has improved a little since last seen. She is also coughing less and now cough is prod with clear sputum. Not taking prilosec before meals but has found doesn't need the neb but twice daily at this point. rec stop smoking, no change rx   May 05, 2010 3 month followup- breathing is the same- no better or worse. No new complaints today. Still smoking. no increase need for rescue, no change doe. rec stop smoking continue ppi ac daily and h2 hs.   July 23, 2010 cc breathing is the same- no better or worse. She c/o nasal congestion and "tickle in throat" x 2 months. rec trial of symbicort and flonase   November 12, 2010 ov Dyspnea and cough- the same no increase need for duoneb. nasal congestion better.  rec Take prednisone 10 mg 4 each am x 2 days, 3 x 2days, 2x2 days, and 1x2 days  2) Continue symbicort 160 2 puffs first thing in am and 2 puffs again in pm about 12 hours later  3) If not satisfied we may need to start back spiriva but if we do you'll need to stop the nebulizer and just use the proaire (albuterol) up to every 4 hours if needed            11/23/2011 f/u ov/Wert still smoking cc doe housework, does ok room to room doesn't go out more  than a couple times a week due to exhaustion more than short breath. Minimal mucoid sputum production mostly in ams, worse in winter, wants to avoid 02 daytime, uses only hs. rec Try tudorza one puff twice daily taken immediately after the symbicort. You should hear a click and the indicator turn back from green to red between puffs - if not, return here with your inhaler.  While on tudorza it's ok to use the duoneb in the short run but the sign that the tudorza is working is less need for the [employer] would likely qualify for ambulatory 02 but I would strongly recommend first you stop smoking - this will do more for you than any medication or doctor   02/29/2012 f/u ov/Wert cc sob better on tudorza but insurance needs rider and failed spiriva previously. No purulent sputum, some increased nasal congestion. Excessive use of daytime neb > saba hfa. Still sob walking more than 100 ft Symbicort 160 Take 2 puffs first thing in am and then another 2 puffs about 12 hours later follow each dose with tudorza Think of your respiratory medications as multiple steps you can take to control  your symptoms and avoid having to go to the ER   Plan A is your maintenance daily no matter what meds: (symbicort and tudorza) Plan B only use after you've used your maintenance (Plan A) medication, and only if you can't catch your breath: proaire Plan C only use after you've used plan A and B and still can't catch your breath: add nebulizer  Plan D(for Doctor):  If you've used A thru C and not doing a lot better or still needing C more than a once a day,  D = call the doctor for evaluation asap Plan E (for ER):  If still not able to catch your breath, even after using your nebulizer up to every 4 hours, go to ER  The key is to stop smoking completely before smoking completely stops you!        05/25/2013 f/u ov/Wert re COPD/ still smoking/ not using 02 Chief Complaint  Patient presents with  . Follow-up    Pt  states SOB and cough are unchanged since her last visit. She denies any new co's today.   saba 1 -2 x daily does help some, mostly day not at night, not using portable 02 as per instructions, doesn't go out much because afrair she'll run out of breath.  Min congested cough mostly in ams white mucus  rec No change rx   09/21/2013 f/u ov/Wert re: still smoking, not using portable 02 system Chief Complaint  Patient presents with  . Follow-up    Breathing is unchanged. Still smoking 1.5 ppd.   rarely needing saba more than 2 x daily. rec  d/c cigs/ no change rx  02/27/2014 f/u ov/Wert re:  GOLD II COPD/ still smoking/maint on symbiocrt and todorza Chief Complaint  Patient presents with  . Followup with PFT    Breathing is progressively worse since last visit. She gets SOB with walking minimal distances.  Cough is unchanged.   using saba avg twice daily "if overdoes it" esp outside, does better slow and flat indoors like shopping    No obvious daytime variabilty or assoc chronic cough or cp or chest tightness, subjective wheeze overt sinus or hb symptoms. No unusual exp hx.   Sleeping ok without nocturnal  or early am exacerbation  of respiratory  c/o's or need for noct saba. Also denies any obvious fluctuation of symptoms with weather or environmental changes or other aggravating or alleviating factors except as outlined above   Current Medications, Allergies, Past Medical History, Past Surgical History, Family History, and Social History were reviewed in [employer] record.  ROS  The following are not active complaints unless bolded sore throat, dysphagia, dental problems, itching, sneezing,  nasal congestion or excess/ purulent secretions, ear ache,   fever, chills, sweats, unintended wt loss, pleuritic or exertional cp, hemoptysis,  orthopnea pnd or leg swelling, presyncope, palpitations, heartburn, abdominal pain, anorexia, nausea, vomiting, diarrhea  or change in  bowel or urinary habits, change in stools or urine, dysuria,hematuria,  rash, arthralgias, visual complaints, headache, numbness weakness or ataxia or problems with walking or coordination,  change in mood/affect or memory.            Past Medical History:  TOBACCO ABUSE (ICD-305.1)  ATHEROSCLEROTIC CARDIOVASCULAR DISEASE (ICD-429.2)   OSTEOARTHRITIS (ICD-715.90)  ALLERGIC RHINITIS (ICD-477.9)  HYPERLIPIDEMIA (ICD-272.4)  OSTEOPOROSIS (ICD-733.00)  COPD (ICD-496)  - PFT's 01/19/07 FEV1 1.33 (72) with ratio of 48 and DLC0 54%  - PFT/s 2/25/11FEV1 .95(54) ratio 41with DLC0 53%  Objective:   Physical Exam  Wt 96 December 31, 2009 > 95 February 10, 2010 > 98 May 05, 2010 > 101 July 23, 2010 > 98 November 12, 2010 > 11/23/2011 100 lb  > 100 02/29/2012 > 06/21/2012 95 > 11/07/2012 95 >  92 02/21/2013 > 05/25/2013  93  > 09/21/2013  93 > 02/27/2014 91   Thin chronically ill appearing wf nad on RA  HEENT mild turbinate edema. Oropharynx no thrush or excess pnd or cobblestoning. No JVD or cervical adenopathy. Mild accessory muscle hypertrophy. Trachea midline, nl thryroid. Chest was hyperinflated by percussion with diminished breath sounds and marked increased exp time without wheeze. Hoover sign positive onset of inspiration. Regular rate and rhythm without murmur gallop or rub or increase P2. No edema. Decrease s1s2 Abd: no hsm, nl excursion. Ext warm without cyanosis or clubbing.     cxr 02/10/10 COPD without acute finding.   Assessment:

## 2014-03-05 LAB — PULMONARY FUNCTION TEST
DL/VA % PRED: 68 %
DL/VA: 3.05 ml/min/mmHg/L
DLCO UNC % PRED: 49 %
DLCO unc: 10.22 ml/min/mmHg
FEF 25-75 Post: 0.46 L/sec
FEF 25-75 Pre: 0.31 L/sec
FEF2575-%Change-Post: 47 %
FEF2575-%PRED-PRE: 25 %
FEF2575-%Pred-Post: 37 %
FEV1-%Change-Post: 15 %
FEV1-%Pred-Post: 57 %
FEV1-%Pred-Pre: 49 %
FEV1-POST: 0.96 L
FEV1-Pre: 0.83 L
FEV1FVC-%CHANGE-POST: -1 %
FEV1FVC-%Pred-Pre: 62 %
FEV6-%Change-Post: 16 %
FEV6-%PRED-PRE: 79 %
FEV6-%Pred-Post: 93 %
FEV6-POST: 1.97 L
FEV6-Pre: 1.69 L
FEV6FVC-%CHANGE-POST: 0 %
FEV6FVC-%PRED-POST: 100 %
FEV6FVC-%Pred-Pre: 100 %
FVC-%CHANGE-POST: 16 %
FVC-%PRED-PRE: 79 %
FVC-%Pred-Post: 93 %
FVC-POST: 2.09 L
FVC-PRE: 1.79 L
POST FEV1/FVC RATIO: 46 %
POST FEV6/FVC RATIO: 94 %
PRE FEV1/FVC RATIO: 46 %
Pre FEV6/FVC Ratio: 94 %
RV % PRED: 167 %
RV: 3.79 L
TLC % pred: 126 %
TLC: 5.85 L

## 2014-04-16 ENCOUNTER — Other Ambulatory Visit: Payer: Self-pay | Admitting: Internal Medicine

## 2014-10-20 DIAGNOSIS — J449 Chronic obstructive pulmonary disease, unspecified: Secondary | ICD-10-CM | POA: Diagnosis not present

## 2014-10-31 DIAGNOSIS — I10 Essential (primary) hypertension: Secondary | ICD-10-CM | POA: Diagnosis not present

## 2014-10-31 DIAGNOSIS — M810 Age-related osteoporosis without current pathological fracture: Secondary | ICD-10-CM | POA: Diagnosis not present

## 2014-10-31 DIAGNOSIS — R7301 Impaired fasting glucose: Secondary | ICD-10-CM | POA: Diagnosis not present

## 2014-10-31 DIAGNOSIS — Z681 Body mass index (BMI) 19 or less, adult: Secondary | ICD-10-CM | POA: Diagnosis not present

## 2014-10-31 DIAGNOSIS — J449 Chronic obstructive pulmonary disease, unspecified: Secondary | ICD-10-CM | POA: Diagnosis not present

## 2014-11-02 ENCOUNTER — Other Ambulatory Visit: Payer: Self-pay | Admitting: Internal Medicine

## 2014-11-08 ENCOUNTER — Encounter: Payer: Self-pay | Admitting: Internal Medicine

## 2014-11-09 DIAGNOSIS — J449 Chronic obstructive pulmonary disease, unspecified: Secondary | ICD-10-CM | POA: Diagnosis not present

## 2014-11-20 DIAGNOSIS — J449 Chronic obstructive pulmonary disease, unspecified: Secondary | ICD-10-CM | POA: Diagnosis not present

## 2014-12-10 DIAGNOSIS — J449 Chronic obstructive pulmonary disease, unspecified: Secondary | ICD-10-CM | POA: Diagnosis not present

## 2014-12-19 DIAGNOSIS — J449 Chronic obstructive pulmonary disease, unspecified: Secondary | ICD-10-CM | POA: Diagnosis not present

## 2015-01-08 DIAGNOSIS — J449 Chronic obstructive pulmonary disease, unspecified: Secondary | ICD-10-CM | POA: Diagnosis not present

## 2015-01-19 DIAGNOSIS — J449 Chronic obstructive pulmonary disease, unspecified: Secondary | ICD-10-CM | POA: Diagnosis not present

## 2015-01-30 DIAGNOSIS — I10 Essential (primary) hypertension: Secondary | ICD-10-CM | POA: Diagnosis not present

## 2015-01-30 DIAGNOSIS — Z681 Body mass index (BMI) 19 or less, adult: Secondary | ICD-10-CM | POA: Diagnosis not present

## 2015-01-30 DIAGNOSIS — R7301 Impaired fasting glucose: Secondary | ICD-10-CM | POA: Diagnosis not present

## 2015-01-30 DIAGNOSIS — R634 Abnormal weight loss: Secondary | ICD-10-CM | POA: Diagnosis not present

## 2015-01-30 DIAGNOSIS — J449 Chronic obstructive pulmonary disease, unspecified: Secondary | ICD-10-CM | POA: Diagnosis not present

## 2015-02-18 DIAGNOSIS — J449 Chronic obstructive pulmonary disease, unspecified: Secondary | ICD-10-CM | POA: Diagnosis not present

## 2015-03-10 DIAGNOSIS — J449 Chronic obstructive pulmonary disease, unspecified: Secondary | ICD-10-CM | POA: Diagnosis not present

## 2015-03-14 DIAGNOSIS — M810 Age-related osteoporosis without current pathological fracture: Secondary | ICD-10-CM | POA: Diagnosis not present

## 2015-03-14 DIAGNOSIS — I2510 Atherosclerotic heart disease of native coronary artery without angina pectoris: Secondary | ICD-10-CM | POA: Diagnosis not present

## 2015-03-14 DIAGNOSIS — I10 Essential (primary) hypertension: Secondary | ICD-10-CM | POA: Diagnosis not present

## 2015-03-14 DIAGNOSIS — E785 Hyperlipidemia, unspecified: Secondary | ICD-10-CM | POA: Diagnosis not present

## 2015-03-14 DIAGNOSIS — R7301 Impaired fasting glucose: Secondary | ICD-10-CM | POA: Diagnosis not present

## 2015-03-21 DIAGNOSIS — J449 Chronic obstructive pulmonary disease, unspecified: Secondary | ICD-10-CM | POA: Diagnosis not present

## 2015-03-21 DIAGNOSIS — H1859 Other hereditary corneal dystrophies: Secondary | ICD-10-CM | POA: Diagnosis not present

## 2015-03-21 DIAGNOSIS — H43813 Vitreous degeneration, bilateral: Secondary | ICD-10-CM | POA: Diagnosis not present

## 2015-03-21 DIAGNOSIS — H01001 Unspecified blepharitis right upper eyelid: Secondary | ICD-10-CM | POA: Diagnosis not present

## 2015-03-21 DIAGNOSIS — Z961 Presence of intraocular lens: Secondary | ICD-10-CM | POA: Diagnosis not present

## 2015-03-22 DIAGNOSIS — G629 Polyneuropathy, unspecified: Secondary | ICD-10-CM | POA: Diagnosis not present

## 2015-03-22 DIAGNOSIS — E785 Hyperlipidemia, unspecified: Secondary | ICD-10-CM | POA: Diagnosis not present

## 2015-03-22 DIAGNOSIS — K30 Functional dyspepsia: Secondary | ICD-10-CM | POA: Diagnosis not present

## 2015-03-22 DIAGNOSIS — I10 Essential (primary) hypertension: Secondary | ICD-10-CM | POA: Diagnosis not present

## 2015-03-22 DIAGNOSIS — Z0000 Encounter for general adult medical examination without abnormal findings: Secondary | ICD-10-CM | POA: Diagnosis not present

## 2015-03-22 DIAGNOSIS — L659 Nonscarring hair loss, unspecified: Secondary | ICD-10-CM | POA: Diagnosis not present

## 2015-03-22 DIAGNOSIS — K5900 Constipation, unspecified: Secondary | ICD-10-CM | POA: Diagnosis not present

## 2015-03-26 DIAGNOSIS — Z1212 Encounter for screening for malignant neoplasm of rectum: Secondary | ICD-10-CM | POA: Diagnosis not present

## 2015-04-10 DIAGNOSIS — J449 Chronic obstructive pulmonary disease, unspecified: Secondary | ICD-10-CM | POA: Diagnosis not present

## 2015-04-20 DIAGNOSIS — J449 Chronic obstructive pulmonary disease, unspecified: Secondary | ICD-10-CM | POA: Diagnosis not present

## 2015-05-10 DIAGNOSIS — J449 Chronic obstructive pulmonary disease, unspecified: Secondary | ICD-10-CM | POA: Diagnosis not present

## 2015-05-21 DIAGNOSIS — J449 Chronic obstructive pulmonary disease, unspecified: Secondary | ICD-10-CM | POA: Diagnosis not present

## 2015-06-10 DIAGNOSIS — J449 Chronic obstructive pulmonary disease, unspecified: Secondary | ICD-10-CM | POA: Diagnosis not present

## 2015-06-18 DIAGNOSIS — J449 Chronic obstructive pulmonary disease, unspecified: Secondary | ICD-10-CM | POA: Diagnosis not present

## 2015-06-20 DEATH — deceased

## 2015-06-21 DIAGNOSIS — J449 Chronic obstructive pulmonary disease, unspecified: Secondary | ICD-10-CM | POA: Diagnosis not present

## 2015-07-16 ENCOUNTER — Encounter: Payer: Self-pay | Admitting: Internal Medicine

## 2015-07-19 DIAGNOSIS — J449 Chronic obstructive pulmonary disease, unspecified: Secondary | ICD-10-CM | POA: Diagnosis not present

## 2015-07-21 DIAGNOSIS — J449 Chronic obstructive pulmonary disease, unspecified: Secondary | ICD-10-CM | POA: Diagnosis not present

## 2015-07-22 DIAGNOSIS — Z23 Encounter for immunization: Secondary | ICD-10-CM | POA: Diagnosis not present

## 2015-07-22 DIAGNOSIS — Z681 Body mass index (BMI) 19 or less, adult: Secondary | ICD-10-CM | POA: Diagnosis not present

## 2015-07-22 DIAGNOSIS — M810 Age-related osteoporosis without current pathological fracture: Secondary | ICD-10-CM | POA: Diagnosis not present

## 2015-07-22 DIAGNOSIS — I2510 Atherosclerotic heart disease of native coronary artery without angina pectoris: Secondary | ICD-10-CM | POA: Diagnosis not present

## 2015-07-22 DIAGNOSIS — L918 Other hypertrophic disorders of the skin: Secondary | ICD-10-CM | POA: Diagnosis not present

## 2015-07-22 DIAGNOSIS — F17200 Nicotine dependence, unspecified, uncomplicated: Secondary | ICD-10-CM | POA: Diagnosis not present

## 2015-07-22 DIAGNOSIS — R7301 Impaired fasting glucose: Secondary | ICD-10-CM | POA: Diagnosis not present

## 2015-07-22 DIAGNOSIS — J449 Chronic obstructive pulmonary disease, unspecified: Secondary | ICD-10-CM | POA: Diagnosis not present

## 2015-08-18 DIAGNOSIS — J449 Chronic obstructive pulmonary disease, unspecified: Secondary | ICD-10-CM | POA: Diagnosis not present

## 2015-08-19 DIAGNOSIS — D225 Melanocytic nevi of trunk: Secondary | ICD-10-CM | POA: Diagnosis not present

## 2015-08-19 DIAGNOSIS — B079 Viral wart, unspecified: Secondary | ICD-10-CM | POA: Diagnosis not present

## 2015-08-21 DIAGNOSIS — J449 Chronic obstructive pulmonary disease, unspecified: Secondary | ICD-10-CM | POA: Diagnosis not present

## 2015-09-18 DIAGNOSIS — J449 Chronic obstructive pulmonary disease, unspecified: Secondary | ICD-10-CM | POA: Diagnosis not present

## 2015-09-20 DIAGNOSIS — J449 Chronic obstructive pulmonary disease, unspecified: Secondary | ICD-10-CM | POA: Diagnosis not present

## 2015-10-21 DIAGNOSIS — J449 Chronic obstructive pulmonary disease, unspecified: Secondary | ICD-10-CM | POA: Diagnosis not present

## 2015-11-21 DIAGNOSIS — J449 Chronic obstructive pulmonary disease, unspecified: Secondary | ICD-10-CM | POA: Diagnosis not present

## 2015-11-21 DIAGNOSIS — R5383 Other fatigue: Secondary | ICD-10-CM | POA: Diagnosis not present

## 2015-11-21 DIAGNOSIS — Z681 Body mass index (BMI) 19 or less, adult: Secondary | ICD-10-CM | POA: Diagnosis not present

## 2015-11-21 DIAGNOSIS — Z1389 Encounter for screening for other disorder: Secondary | ICD-10-CM | POA: Diagnosis not present

## 2015-11-21 DIAGNOSIS — R634 Abnormal weight loss: Secondary | ICD-10-CM | POA: Diagnosis not present

## 2015-11-21 DIAGNOSIS — F17200 Nicotine dependence, unspecified, uncomplicated: Secondary | ICD-10-CM | POA: Diagnosis not present

## 2015-11-21 DIAGNOSIS — R7301 Impaired fasting glucose: Secondary | ICD-10-CM | POA: Diagnosis not present

## 2015-11-21 DIAGNOSIS — R682 Dry mouth, unspecified: Secondary | ICD-10-CM | POA: Diagnosis not present

## 2015-12-19 DIAGNOSIS — J449 Chronic obstructive pulmonary disease, unspecified: Secondary | ICD-10-CM | POA: Diagnosis not present

## 2016-01-19 DIAGNOSIS — J449 Chronic obstructive pulmonary disease, unspecified: Secondary | ICD-10-CM | POA: Diagnosis not present

## 2016-02-17 ENCOUNTER — Encounter: Payer: Self-pay | Admitting: Internal Medicine

## 2016-02-18 DIAGNOSIS — J449 Chronic obstructive pulmonary disease, unspecified: Secondary | ICD-10-CM | POA: Diagnosis not present

## 2016-03-20 DIAGNOSIS — J449 Chronic obstructive pulmonary disease, unspecified: Secondary | ICD-10-CM | POA: Diagnosis not present

## 2016-04-19 DIAGNOSIS — J449 Chronic obstructive pulmonary disease, unspecified: Secondary | ICD-10-CM | POA: Diagnosis not present

## 2016-04-20 DIAGNOSIS — R8299 Other abnormal findings in urine: Secondary | ICD-10-CM | POA: Diagnosis not present

## 2016-04-20 DIAGNOSIS — I10 Essential (primary) hypertension: Secondary | ICD-10-CM | POA: Diagnosis not present

## 2016-04-20 DIAGNOSIS — E784 Other hyperlipidemia: Secondary | ICD-10-CM | POA: Diagnosis not present

## 2016-04-20 DIAGNOSIS — N390 Urinary tract infection, site not specified: Secondary | ICD-10-CM | POA: Diagnosis not present

## 2016-04-20 DIAGNOSIS — M810 Age-related osteoporosis without current pathological fracture: Secondary | ICD-10-CM | POA: Diagnosis not present

## 2016-04-20 DIAGNOSIS — R7301 Impaired fasting glucose: Secondary | ICD-10-CM | POA: Diagnosis not present

## 2016-04-24 DIAGNOSIS — M25519 Pain in unspecified shoulder: Secondary | ICD-10-CM | POA: Diagnosis not present

## 2016-04-24 DIAGNOSIS — K5909 Other constipation: Secondary | ICD-10-CM | POA: Diagnosis not present

## 2016-04-24 DIAGNOSIS — I498 Other specified cardiac arrhythmias: Secondary | ICD-10-CM | POA: Diagnosis not present

## 2016-04-24 DIAGNOSIS — E784 Other hyperlipidemia: Secondary | ICD-10-CM | POA: Diagnosis not present

## 2016-04-24 DIAGNOSIS — L918 Other hypertrophic disorders of the skin: Secondary | ICD-10-CM | POA: Diagnosis not present

## 2016-04-24 DIAGNOSIS — I2510 Atherosclerotic heart disease of native coronary artery without angina pectoris: Secondary | ICD-10-CM | POA: Diagnosis not present

## 2016-04-24 DIAGNOSIS — Z0000 Encounter for general adult medical examination without abnormal findings: Secondary | ICD-10-CM | POA: Diagnosis not present

## 2016-04-24 DIAGNOSIS — R5383 Other fatigue: Secondary | ICD-10-CM | POA: Diagnosis not present

## 2016-04-24 DIAGNOSIS — J449 Chronic obstructive pulmonary disease, unspecified: Secondary | ICD-10-CM | POA: Diagnosis not present

## 2016-05-18 DIAGNOSIS — E7800 Pure hypercholesterolemia, unspecified: Secondary | ICD-10-CM | POA: Diagnosis not present

## 2016-05-18 DIAGNOSIS — I10 Essential (primary) hypertension: Secondary | ICD-10-CM | POA: Diagnosis not present

## 2016-05-18 DIAGNOSIS — I4891 Unspecified atrial fibrillation: Secondary | ICD-10-CM | POA: Diagnosis not present

## 2016-05-18 DIAGNOSIS — F17200 Nicotine dependence, unspecified, uncomplicated: Secondary | ICD-10-CM | POA: Diagnosis not present

## 2016-05-20 DIAGNOSIS — J449 Chronic obstructive pulmonary disease, unspecified: Secondary | ICD-10-CM | POA: Diagnosis not present

## 2016-06-18 DIAGNOSIS — R0602 Shortness of breath: Secondary | ICD-10-CM | POA: Diagnosis not present

## 2016-06-18 DIAGNOSIS — I4891 Unspecified atrial fibrillation: Secondary | ICD-10-CM | POA: Diagnosis not present

## 2016-06-18 DIAGNOSIS — I739 Peripheral vascular disease, unspecified: Secondary | ICD-10-CM | POA: Diagnosis not present

## 2016-06-19 DIAGNOSIS — I4891 Unspecified atrial fibrillation: Secondary | ICD-10-CM | POA: Diagnosis not present

## 2016-06-19 DIAGNOSIS — R0602 Shortness of breath: Secondary | ICD-10-CM | POA: Diagnosis not present

## 2016-06-20 DIAGNOSIS — J449 Chronic obstructive pulmonary disease, unspecified: Secondary | ICD-10-CM | POA: Diagnosis not present

## 2016-06-29 DIAGNOSIS — I480 Paroxysmal atrial fibrillation: Secondary | ICD-10-CM | POA: Diagnosis not present

## 2016-06-29 DIAGNOSIS — I10 Essential (primary) hypertension: Secondary | ICD-10-CM | POA: Diagnosis not present

## 2016-06-29 DIAGNOSIS — F17200 Nicotine dependence, unspecified, uncomplicated: Secondary | ICD-10-CM | POA: Diagnosis not present

## 2016-06-29 DIAGNOSIS — E7800 Pure hypercholesterolemia, unspecified: Secondary | ICD-10-CM | POA: Diagnosis not present

## 2016-07-01 ENCOUNTER — Emergency Department (HOSPITAL_COMMUNITY)
Admission: EM | Admit: 2016-07-01 | Discharge: 2016-07-01 | Disposition: A | Discharge status: Home / Self Care | Payer: Commercial Managed Care - HMO | Attending: Emergency Medicine | Admitting: Emergency Medicine

## 2016-07-01 ENCOUNTER — Emergency Department (HOSPITAL_COMMUNITY): Payer: Commercial Managed Care - HMO

## 2016-07-01 ENCOUNTER — Encounter (HOSPITAL_COMMUNITY): Payer: Self-pay | Admitting: Emergency Medicine

## 2016-07-01 DIAGNOSIS — F17210 Nicotine dependence, cigarettes, uncomplicated: Secondary | ICD-10-CM | POA: Diagnosis not present

## 2016-07-01 DIAGNOSIS — Z7951 Long term (current) use of inhaled steroids: Secondary | ICD-10-CM | POA: Insufficient documentation

## 2016-07-01 DIAGNOSIS — Y999 Unspecified external cause status: Secondary | ICD-10-CM | POA: Insufficient documentation

## 2016-07-01 DIAGNOSIS — Z79899 Other long term (current) drug therapy: Secondary | ICD-10-CM | POA: Diagnosis not present

## 2016-07-01 DIAGNOSIS — W010XXA Fall on same level from slipping, tripping and stumbling without subsequent striking against object, initial encounter: Secondary | ICD-10-CM | POA: Insufficient documentation

## 2016-07-01 DIAGNOSIS — M25572 Pain in left ankle and joints of left foot: Secondary | ICD-10-CM | POA: Diagnosis not present

## 2016-07-01 DIAGNOSIS — J449 Chronic obstructive pulmonary disease, unspecified: Secondary | ICD-10-CM | POA: Insufficient documentation

## 2016-07-01 DIAGNOSIS — S8992XA Unspecified injury of left lower leg, initial encounter: Secondary | ICD-10-CM | POA: Diagnosis present

## 2016-07-01 DIAGNOSIS — S81811A Laceration without foreign body, right lower leg, initial encounter: Secondary | ICD-10-CM

## 2016-07-01 DIAGNOSIS — Y9389 Activity, other specified: Secondary | ICD-10-CM | POA: Insufficient documentation

## 2016-07-01 DIAGNOSIS — S82832A Other fracture of upper and lower end of left fibula, initial encounter for closed fracture: Secondary | ICD-10-CM | POA: Diagnosis not present

## 2016-07-01 DIAGNOSIS — R404 Transient alteration of awareness: Secondary | ICD-10-CM | POA: Diagnosis not present

## 2016-07-01 DIAGNOSIS — Z23 Encounter for immunization: Secondary | ICD-10-CM | POA: Diagnosis not present

## 2016-07-01 DIAGNOSIS — S82402A Unspecified fracture of shaft of left fibula, initial encounter for closed fracture: Secondary | ICD-10-CM

## 2016-07-01 DIAGNOSIS — S82492A Other fracture of shaft of left fibula, initial encounter for closed fracture: Secondary | ICD-10-CM | POA: Insufficient documentation

## 2016-07-01 DIAGNOSIS — Y92009 Unspecified place in unspecified non-institutional (private) residence as the place of occurrence of the external cause: Secondary | ICD-10-CM | POA: Insufficient documentation

## 2016-07-01 DIAGNOSIS — R531 Weakness: Secondary | ICD-10-CM | POA: Diagnosis not present

## 2016-07-01 MED ORDER — ACETAMINOPHEN-CODEINE #3 300-30 MG PO TABS: 1.0000 | ORAL_TABLET | Freq: Four times a day (QID) | ORAL | 0 refills | Status: AC | PRN

## 2016-07-01 MED ORDER — LORAZEPAM 2 MG/ML IJ SOLN: 1.0000 mg | Freq: Once | INTRAMUSCULAR | Status: DC

## 2016-07-01 MED ORDER — TETANUS-DIPHTH-ACELL PERTUSSIS 5-2.5-18.5 LF-MCG/0.5 IM SUSP
0.5000 mL | Freq: Once | INTRAMUSCULAR | Status: AC
  Administered 2016-07-01: 0.5 mL via INTRAMUSCULAR
  Filled 2016-07-01: qty 0.5

## 2016-07-01 NOTE — ED Notes (Signed)
Pt ambulated to wheelchair for bathroom.

## 2016-07-01 NOTE — ED Provider Notes (Signed)
[facility] DEPT Provider Note   CSN: [id number] Arrival date & time: 07/01/16  [id number]     History   Chief Complaint Chief Complaint  Patient presents with  . Fall    HPI Janice Tucker is a 82 y.o. female.  HPI   82 year old female with history of osteoporosis, COPD, tobacco abuse brought here via EMS from home for evaluation of a recent fall. Patient lives at home by herself. Patient report last night her power went out and she slept on her sofa. The power came back on around 5:30 in the morning, patient woke up and was trying to get out of the sofa when she realized her left leg was asleep.  She fell down the ground and injured both of her ankles, left worse than right. She was able to ambulate afterward. She does complaining of sharp pain to the lateral aspects of the left ankle and right ankle worsening with ambulation. Pain is nonradiating. She also suffered a skin tear to her right lower extremity. Patient is on Eliquis. She denies any precipitating symptoms prior to her fall. She reports her leg is no longer asleep.  She denies any head injury, loss of consciousness, neck pain, chest pain, difficulty breathing, back pain, hip pain, or knee pain. No dysuria. She denies hearing any cracks or pops. She did take an ibuprofen prior to arrival which has helped. She does not want any pain medication at this time.  Past Medical History:  Diagnosis Date  . Allergic rhinitis   . COPD (chronic obstructive pulmonary disease)   . History of ASCVD (atherosclerotic cardiovascular disease)   . Hyperlipemia   . Leukocytosis   . Osteoarthritis   . Osteoporosis   . Tobacco abuse     Patient Active Problem List   Diagnosis Date Noted  . RESPIRATORY FAILURE, CHRONIC 02/10/2010  . WEIGHT LOSS, ABNORMAL 11/28/2009  . NAUSEA 11/28/2009  . COLONIC POLYPS, HYPERPLASTIC, HX OF 11/28/2009  . Smoker 10/17/2007  . HYPERLIPIDEMIA 09/22/2007  . LEUKOCYTOSIS 09/22/2007  . ATHEROSCLEROTIC  CARDIOVASCULAR DISEASE 09/22/2007  . ALLERGIC RHINITIS 09/22/2007  . COPD GOLD II 09/22/2007  . OSTEOARTHRITIS 09/22/2007  . OSTEOPOROSIS 09/22/2007    Past Surgical History:  Procedure Laterality Date  . angiogram      OB History    No data available       Home Medications    Prior to Admission medications   Medication Sig Start Date End Date Taking? Authorizing Provider  albuterol (PROVENTIL HFA;VENTOLIN HFA) 108 (90 BASE) MCG/ACT inhaler Inhale 2 puffs into the lungs every 4 (four) hours as needed.    Historical Provider, MD  aspirin 81 MG tablet Take 81 mg by mouth every other day.    Historical Provider, MD  atorvastatin (LIPITOR) 80 MG tablet Take 40 mg by mouth Daily.  02/04/12   Historical Provider, MD  Calcium-Vitamin D 600-125 MG-UNIT TABS Take 1 tablet by mouth daily.    Historical Provider, MD  citalopram (CELEXA) 20 MG tablet Take 1 tablet by mouth Daily.    Historical Provider, MD  fluticasone (FLONASE) 50 MCG/ACT nasal spray Place 2 sprays into the nose Daily.    Historical Provider, MD  ipratropium (ATROVENT) 0.06 % nasal spray USE TWO SPRAYS EACH NOSTRIL FOUR TIMES DAILY     Tanda Rockers, MD  Multiple Vitamin (ONE-A-DAY 55 PLUS PO) Take 1 tablet by mouth daily.    Historical Provider, MD  omeprazole (PRILOSEC) 20 MG capsule 1 capsule oral twice daily  02/11/11   Lafayette Dragon, MD  sodium chloride (OCEAN) 0.65 % nasal spray Place 1 spray into the nose as needed for congestion.    Historical Provider, MD  SYMBICORT 160-4.5 MCG/ACT inhaler INHALE TWO PUFFS BY MOUTH TWICE DAILY  11/05/14   Tanda Rockers, MD  TUDORZA PRESSAIR 400 MCG/ACT AEPB Inhale one puff by mouth twice daily    Tanda Rockers, MD    Family History Family History  Problem Relation Age of Onset  . Heart disease Father     Social History Social History  Substance Use Topics  . Smoking status: Current Every Day Smoker    Packs/day: 1.50    Years: 63.00    Types: Cigarettes  . Smokeless  tobacco: Never Used     Comment: started smoking at age 32   . Alcohol use No     Allergies   Review of patient's allergies indicates no known allergies.   Review of Systems Review of Systems  All other systems reviewed and are negative.    Physical Exam Updated Vital Signs There were no vitals taken for this visit.   Physical Exam  Constitutional: She appears well-developed and well-nourished. No distress.  Well-appearing elderly female sitting in bed in no acute discomfort.  HENT:  Head: Normocephalic and atraumatic.  No scalp tenderness or any signs of head injury.  Eyes: Conjunctivae are normal.  Neck: Normal range of motion. Neck supple.  No cervical midline spine tenderness crepitus or step-off.  Cardiovascular: Normal rate and regular rhythm.   Pulmonary/Chest: Effort normal and breath sounds normal.  Abdominal: Soft. There is no tenderness.  Musculoskeletal: She exhibits tenderness (Left ankle: Tenderness to lateral malleolus region with associated bruising but no crepitus or deformity. Normal ankle flexion extension inversion and eversion. Pedal pulse palpable. ).  Right ankle: Mild tenderness lateral malleolus region without bruising or deformity. A 4 cm skin tear noted to the right anterior distal lower leg without bony tenderness.  Neurological: She is alert.  Skin: No rash noted.  Psychiatric: She has a normal mood and affect.  Nursing note and vitals reviewed.    ED Treatments / Results  Labs (all labs ordered are listed, but only abnormal results are displayed) Labs Reviewed - No data to display  EKG  EKG Interpretation None       Radiology Dg Ankle Complete Left  Result Date: 07/01/2016 CLINICAL DATA:  [place] over furniture in the home this morning. EXAM: LEFT ANKLE COMPLETE - 3+ VIEW COMPARISON:  None. FINDINGS: Nondisplaced fracture of the distal fibula. Small ankle joint effusion. Lateral soft tissue swelling. No other finding. IMPRESSION:  Nondisplaced fracture of the distal fibula. Electronically Signed   By: Nelson Chimes M.D.   On: 07/01/2016 19:19   Dg Ankle Complete Right  Result Date: 07/01/2016 CLINICAL DATA:  Tripped and fell at home. Right ankle pain, swelling, and bruising. Initial encounter. EXAM: RIGHT ANKLE - COMPLETE 3+ VIEW COMPARISON:  None. FINDINGS: There is no evidence of fracture, dislocation, or joint effusion. There is no evidence of arthropathy or other focal bone abnormality. Small plantar calcaneal bone spur noted. Generalized osteopenia. Soft tissues are unremarkable. IMPRESSION: No acute findings. Electronically Signed   By: Earle Gell M.D.   On: 07/01/2016 19:20    Procedures Procedures (including critical care time)  Medications Ordered in ED Medications  Tdap (BOOSTRIX) injection 0.5 mL (0.5 mLs Intramuscular Given 07/01/16 1856)     Initial Impression / Assessment and Plan / ED  Course  I have reviewed the triage vital signs and the nursing notes.  Pertinent labs & imaging results that were available during my care of the patient were reviewed by me and considered in my medical decision making (see chart for details).  Clinical Course    BP 181/72 (BP Location: Left Arm)   Pulse 96   Temp 98.4 F (36.9 C) (Oral)   Resp 18   SpO2 98%  The patient was noted to be hypertensive today in the emergency department. I have spoken with the patient regarding hypertension and the need for improved management. I instructed the patient to followup with the Primary care doctor within 4 days to improve the management of the patient's hypertension. I also counseled the patient regarding the signs and symptoms which would require an emergent visit to an emergency department for hypertensive urgency and/or hypertensive emergency. The patient understood the need for improved hypertensive management.   Final Clinical Impressions(s) / ED Diagnoses   Final diagnoses:  Left fibular fracture, closed, initial  encounter  Noninfected skin tear of right leg, initial encounter    New Prescriptions New Prescriptions   ACETAMINOPHEN-CODEINE (TYLENOL #3) 300-30 MG TABLET    Take 1-2 tablets by mouth every 6 (six) hours as needed for moderate pain.   6:59 PM Pt had a mechanical fall this AM when she got out of her sofa from sleeping on it (she does not normally sleep on her sofa).  She has tenderness to her R and L ankle.  She has a skin tear to RLE which we will cleanse and apply dressing.  Will update tdap and obtain xrays of both ankles.  No other injury noted.  Pain medication offered but pt declined.  Care discussed with DR. Bland Span.    7:49 PM Xray of L ankle demonstrates L distal fibular nondisplaced fracture.  This is a closed fracture.  Pt is NVI.  Given her age, she will benefit from a cam walker and a walker.  She will be discharge with pain medication as needed.  She will f/u closely with ortho and with her PCP for further management. I also offer head and cervical spine CT to r/o occult injury given that pt is on anticoagulants.  Pt however declined stating that she has no head/neck pain and denies any injury to the aforementioned locations.    8:15 PM Pt able to ambulate with walker.  She will f/u with her PCP and with orthopedist to help establish outpt rehab as necessary.     Domenic Moras, PA-C 07/01/16 2022    Duffy Bruce, MD 07/02/16 [phone]

## 2016-07-01 NOTE — ED Notes (Signed)
Ortho tech at bedside 

## 2016-07-01 NOTE — Discharge Instructions (Signed)
You have been evaluated for a recent fall.  You have a broken left ankle (distal fibular fracture).  Please wear cam walker boot to help provide stability and support.  Use a walker to help move around.  Take tylenol #3 as needed for pain.  You also have a skin tear.  Check the wound daily and change dressing to decrease risk of skin infection.  Followup with orthopedist and with your primary care provider next week for further care.

## 2016-07-01 NOTE — ED Triage Notes (Signed)
Per EMS-fell over ottoman around 5 am-bruising to left ankle-positive pulses

## 2016-07-01 NOTE — ED Notes (Signed)
Patient transported to X-ray 

## 2016-07-01 NOTE — ED Notes (Signed)
Patient was alert, oriented and stable upon discharge. RN went over AVS and patient had no further questions.  

## 2016-07-03 DIAGNOSIS — S8265XA Nondisplaced fracture of lateral malleolus of left fibula, initial encounter for closed fracture: Secondary | ICD-10-CM | POA: Diagnosis not present

## 2016-07-15 DIAGNOSIS — S82302D Unspecified fracture of lower end of left tibia, subsequent encounter for closed fracture with routine healing: Secondary | ICD-10-CM | POA: Diagnosis not present

## 2016-07-15 DIAGNOSIS — I7389 Other specified peripheral vascular diseases: Secondary | ICD-10-CM | POA: Diagnosis not present

## 2016-07-15 DIAGNOSIS — J449 Chronic obstructive pulmonary disease, unspecified: Secondary | ICD-10-CM | POA: Diagnosis not present

## 2016-07-15 DIAGNOSIS — I739 Peripheral vascular disease, unspecified: Secondary | ICD-10-CM | POA: Diagnosis not present

## 2016-07-15 DIAGNOSIS — I2510 Atherosclerotic heart disease of native coronary artery without angina pectoris: Secondary | ICD-10-CM | POA: Diagnosis not present

## 2016-07-15 DIAGNOSIS — R627 Adult failure to thrive: Secondary | ICD-10-CM | POA: Diagnosis not present

## 2016-07-15 DIAGNOSIS — I10 Essential (primary) hypertension: Secondary | ICD-10-CM | POA: Diagnosis not present

## 2016-07-15 DIAGNOSIS — I4891 Unspecified atrial fibrillation: Secondary | ICD-10-CM | POA: Diagnosis not present

## 2016-07-15 DIAGNOSIS — G6289 Other specified polyneuropathies: Secondary | ICD-10-CM | POA: Diagnosis not present

## 2016-07-15 DIAGNOSIS — F17200 Nicotine dependence, unspecified, uncomplicated: Secondary | ICD-10-CM | POA: Diagnosis not present

## 2016-07-17 DIAGNOSIS — J449 Chronic obstructive pulmonary disease, unspecified: Secondary | ICD-10-CM | POA: Diagnosis not present

## 2016-07-17 DIAGNOSIS — I739 Peripheral vascular disease, unspecified: Secondary | ICD-10-CM | POA: Diagnosis not present

## 2016-07-17 DIAGNOSIS — R627 Adult failure to thrive: Secondary | ICD-10-CM | POA: Diagnosis not present

## 2016-07-17 DIAGNOSIS — S82302D Unspecified fracture of lower end of left tibia, subsequent encounter for closed fracture with routine healing: Secondary | ICD-10-CM | POA: Diagnosis not present

## 2016-07-17 DIAGNOSIS — I10 Essential (primary) hypertension: Secondary | ICD-10-CM | POA: Diagnosis not present

## 2016-07-20 DIAGNOSIS — I739 Peripheral vascular disease, unspecified: Secondary | ICD-10-CM | POA: Diagnosis not present

## 2016-07-20 DIAGNOSIS — S82302D Unspecified fracture of lower end of left tibia, subsequent encounter for closed fracture with routine healing: Secondary | ICD-10-CM | POA: Diagnosis not present

## 2016-07-20 DIAGNOSIS — I10 Essential (primary) hypertension: Secondary | ICD-10-CM | POA: Diagnosis not present

## 2016-07-20 DIAGNOSIS — J449 Chronic obstructive pulmonary disease, unspecified: Secondary | ICD-10-CM | POA: Diagnosis not present

## 2016-07-20 DIAGNOSIS — R627 Adult failure to thrive: Secondary | ICD-10-CM | POA: Diagnosis not present

## 2016-07-21 DIAGNOSIS — S82302D Unspecified fracture of lower end of left tibia, subsequent encounter for closed fracture with routine healing: Secondary | ICD-10-CM | POA: Diagnosis not present

## 2016-07-21 DIAGNOSIS — J449 Chronic obstructive pulmonary disease, unspecified: Secondary | ICD-10-CM | POA: Diagnosis not present

## 2016-07-21 DIAGNOSIS — I739 Peripheral vascular disease, unspecified: Secondary | ICD-10-CM | POA: Diagnosis not present

## 2016-07-21 DIAGNOSIS — I10 Essential (primary) hypertension: Secondary | ICD-10-CM | POA: Diagnosis not present

## 2016-07-21 DIAGNOSIS — R627 Adult failure to thrive: Secondary | ICD-10-CM | POA: Diagnosis not present

## 2016-07-22 DIAGNOSIS — I739 Peripheral vascular disease, unspecified: Secondary | ICD-10-CM | POA: Diagnosis not present

## 2016-07-22 DIAGNOSIS — R627 Adult failure to thrive: Secondary | ICD-10-CM | POA: Diagnosis not present

## 2016-07-22 DIAGNOSIS — J449 Chronic obstructive pulmonary disease, unspecified: Secondary | ICD-10-CM | POA: Diagnosis not present

## 2016-07-22 DIAGNOSIS — S82302D Unspecified fracture of lower end of left tibia, subsequent encounter for closed fracture with routine healing: Secondary | ICD-10-CM | POA: Diagnosis not present

## 2016-07-22 DIAGNOSIS — I10 Essential (primary) hypertension: Secondary | ICD-10-CM | POA: Diagnosis not present

## 2016-07-24 DIAGNOSIS — S82302D Unspecified fracture of lower end of left tibia, subsequent encounter for closed fracture with routine healing: Secondary | ICD-10-CM | POA: Diagnosis not present

## 2016-07-24 DIAGNOSIS — R627 Adult failure to thrive: Secondary | ICD-10-CM | POA: Diagnosis not present

## 2016-07-24 DIAGNOSIS — J449 Chronic obstructive pulmonary disease, unspecified: Secondary | ICD-10-CM | POA: Diagnosis not present

## 2016-07-24 DIAGNOSIS — I10 Essential (primary) hypertension: Secondary | ICD-10-CM | POA: Diagnosis not present

## 2016-07-24 DIAGNOSIS — I739 Peripheral vascular disease, unspecified: Secondary | ICD-10-CM | POA: Diagnosis not present

## 2016-07-27 DIAGNOSIS — I739 Peripheral vascular disease, unspecified: Secondary | ICD-10-CM | POA: Diagnosis not present

## 2016-07-27 DIAGNOSIS — I10 Essential (primary) hypertension: Secondary | ICD-10-CM | POA: Diagnosis not present

## 2016-07-27 DIAGNOSIS — R627 Adult failure to thrive: Secondary | ICD-10-CM | POA: Diagnosis not present

## 2016-07-27 DIAGNOSIS — J449 Chronic obstructive pulmonary disease, unspecified: Secondary | ICD-10-CM | POA: Diagnosis not present

## 2016-07-27 DIAGNOSIS — S82302D Unspecified fracture of lower end of left tibia, subsequent encounter for closed fracture with routine healing: Secondary | ICD-10-CM | POA: Diagnosis not present

## 2016-07-29 DIAGNOSIS — I10 Essential (primary) hypertension: Secondary | ICD-10-CM | POA: Diagnosis not present

## 2016-07-29 DIAGNOSIS — I739 Peripheral vascular disease, unspecified: Secondary | ICD-10-CM | POA: Diagnosis not present

## 2016-07-29 DIAGNOSIS — R627 Adult failure to thrive: Secondary | ICD-10-CM | POA: Diagnosis not present

## 2016-07-29 DIAGNOSIS — J449 Chronic obstructive pulmonary disease, unspecified: Secondary | ICD-10-CM | POA: Diagnosis not present

## 2016-07-29 DIAGNOSIS — S82302D Unspecified fracture of lower end of left tibia, subsequent encounter for closed fracture with routine healing: Secondary | ICD-10-CM | POA: Diagnosis not present

## 2016-07-30 DIAGNOSIS — J449 Chronic obstructive pulmonary disease, unspecified: Secondary | ICD-10-CM | POA: Diagnosis not present

## 2016-07-30 DIAGNOSIS — I739 Peripheral vascular disease, unspecified: Secondary | ICD-10-CM | POA: Diagnosis not present

## 2016-07-30 DIAGNOSIS — R627 Adult failure to thrive: Secondary | ICD-10-CM | POA: Diagnosis not present

## 2016-07-30 DIAGNOSIS — I10 Essential (primary) hypertension: Secondary | ICD-10-CM | POA: Diagnosis not present

## 2016-07-30 DIAGNOSIS — S82302D Unspecified fracture of lower end of left tibia, subsequent encounter for closed fracture with routine healing: Secondary | ICD-10-CM | POA: Diagnosis not present

## 2016-07-31 DIAGNOSIS — I739 Peripheral vascular disease, unspecified: Secondary | ICD-10-CM | POA: Diagnosis not present

## 2016-07-31 DIAGNOSIS — I10 Essential (primary) hypertension: Secondary | ICD-10-CM | POA: Diagnosis not present

## 2016-07-31 DIAGNOSIS — J449 Chronic obstructive pulmonary disease, unspecified: Secondary | ICD-10-CM | POA: Diagnosis not present

## 2016-07-31 DIAGNOSIS — R627 Adult failure to thrive: Secondary | ICD-10-CM | POA: Diagnosis not present

## 2016-07-31 DIAGNOSIS — S82302D Unspecified fracture of lower end of left tibia, subsequent encounter for closed fracture with routine healing: Secondary | ICD-10-CM | POA: Diagnosis not present

## 2016-08-03 DIAGNOSIS — I739 Peripheral vascular disease, unspecified: Secondary | ICD-10-CM | POA: Diagnosis not present

## 2016-08-03 DIAGNOSIS — R627 Adult failure to thrive: Secondary | ICD-10-CM | POA: Diagnosis not present

## 2016-08-03 DIAGNOSIS — S82302D Unspecified fracture of lower end of left tibia, subsequent encounter for closed fracture with routine healing: Secondary | ICD-10-CM | POA: Diagnosis not present

## 2016-08-03 DIAGNOSIS — J449 Chronic obstructive pulmonary disease, unspecified: Secondary | ICD-10-CM | POA: Diagnosis not present

## 2016-08-03 DIAGNOSIS — I10 Essential (primary) hypertension: Secondary | ICD-10-CM | POA: Diagnosis not present

## 2016-08-04 ENCOUNTER — Ambulatory Visit (INDEPENDENT_AMBULATORY_CARE_PROVIDER_SITE_OTHER): Payer: Self-pay | Admitting: Orthopaedic Surgery

## 2016-08-04 DIAGNOSIS — Z23 Encounter for immunization: Secondary | ICD-10-CM | POA: Diagnosis not present

## 2016-08-04 DIAGNOSIS — R0901 Asphyxia: Secondary | ICD-10-CM | POA: Diagnosis not present

## 2016-08-04 DIAGNOSIS — I10 Essential (primary) hypertension: Secondary | ICD-10-CM | POA: Diagnosis not present

## 2016-08-04 DIAGNOSIS — J449 Chronic obstructive pulmonary disease, unspecified: Secondary | ICD-10-CM | POA: Diagnosis not present

## 2016-08-04 DIAGNOSIS — Z9981 Dependence on supplemental oxygen: Secondary | ICD-10-CM | POA: Diagnosis not present

## 2016-08-04 DIAGNOSIS — I4891 Unspecified atrial fibrillation: Secondary | ICD-10-CM | POA: Diagnosis not present

## 2016-08-04 DIAGNOSIS — L89309 Pressure ulcer of unspecified buttock, unspecified stage: Secondary | ICD-10-CM | POA: Diagnosis not present

## 2016-08-07 DIAGNOSIS — S82302D Unspecified fracture of lower end of left tibia, subsequent encounter for closed fracture with routine healing: Secondary | ICD-10-CM | POA: Diagnosis not present

## 2016-08-07 DIAGNOSIS — I739 Peripheral vascular disease, unspecified: Secondary | ICD-10-CM | POA: Diagnosis not present

## 2016-08-07 DIAGNOSIS — I10 Essential (primary) hypertension: Secondary | ICD-10-CM | POA: Diagnosis not present

## 2016-08-07 DIAGNOSIS — R627 Adult failure to thrive: Secondary | ICD-10-CM | POA: Diagnosis not present

## 2016-08-07 DIAGNOSIS — J449 Chronic obstructive pulmonary disease, unspecified: Secondary | ICD-10-CM | POA: Diagnosis not present

## 2016-08-10 DIAGNOSIS — R627 Adult failure to thrive: Secondary | ICD-10-CM | POA: Diagnosis not present

## 2016-08-10 DIAGNOSIS — I10 Essential (primary) hypertension: Secondary | ICD-10-CM | POA: Diagnosis not present

## 2016-08-10 DIAGNOSIS — S82302D Unspecified fracture of lower end of left tibia, subsequent encounter for closed fracture with routine healing: Secondary | ICD-10-CM | POA: Diagnosis not present

## 2016-08-10 DIAGNOSIS — I739 Peripheral vascular disease, unspecified: Secondary | ICD-10-CM | POA: Diagnosis not present

## 2016-08-10 DIAGNOSIS — J449 Chronic obstructive pulmonary disease, unspecified: Secondary | ICD-10-CM | POA: Diagnosis not present

## 2016-08-12 DIAGNOSIS — J449 Chronic obstructive pulmonary disease, unspecified: Secondary | ICD-10-CM | POA: Diagnosis not present

## 2016-08-25 ENCOUNTER — Ambulatory Visit (INDEPENDENT_AMBULATORY_CARE_PROVIDER_SITE_OTHER): Payer: Commercial Managed Care - HMO

## 2016-08-25 ENCOUNTER — Ambulatory Visit (INDEPENDENT_AMBULATORY_CARE_PROVIDER_SITE_OTHER): Payer: Commercial Managed Care - HMO | Admitting: Orthopaedic Surgery

## 2016-08-25 ENCOUNTER — Encounter (INDEPENDENT_AMBULATORY_CARE_PROVIDER_SITE_OTHER): Payer: Self-pay | Admitting: Orthopaedic Surgery

## 2016-08-25 DIAGNOSIS — S8265XA Nondisplaced fracture of lateral malleolus of left fibula, initial encounter for closed fracture: Secondary | ICD-10-CM

## 2016-08-25 NOTE — Progress Notes (Signed)
   Office Visit Note   Patient: Janice Tucker           Date of Birth: 31-Dec-1930           MRN: [id number] Visit Date: 08/25/2016              Requested by: Crist Infante, MD [id number] [place], [place] [address] PCP: Jerlyn Ly, MD   Assessment & Plan: Visit Diagnoses:  1. Nondisplaced fracture of lateral malleolus of left fibula, initial encounter for closed fracture     Plan:  - xrays show good bony healing - d/c CAM, reg shoes - HHPT for gait training - f/u prn  Follow-Up Instructions: Return if symptoms worsen or fail to improve.   Orders:  Orders Placed This Encounter  Procedures  . XR Ankle Complete Left   No orders of the defined types were placed in this encounter.     Procedures: No procedures performed   Clinical Data: No additional findings.   Subjective: Chief Complaint  Patient presents with  . Left Ankle - Fracture, Follow-up, Pain    8 week fracture follow up visit.  Not c/o much pain.  Occasional numbness in toes.  Doesn't like wearing the CAM boot.      Review of Systems   Objective: Vital Signs: There were no vitals taken for this visit.  Physical Exam  Left Ankle Exam  Swelling: none  Tenderness  The patient is experiencing no tenderness.   Range of Motion  The patient has normal left ankle ROM.   Muscle Strength  The patient has normal left ankle strength.      Specialty Comments:  No specialty comments available.  Imaging: Xr Ankle Complete Left  Result Date: 08/25/2016 Healed distal fibula fracture with bony consolidation    PMFS History: Patient Active Problem List   Diagnosis Date Noted  . RESPIRATORY FAILURE, CHRONIC 02/10/2010  . WEIGHT LOSS, ABNORMAL 11/28/2009  . NAUSEA 11/28/2009  . COLONIC POLYPS, HYPERPLASTIC, HX OF 11/28/2009  . Smoker 10/17/2007  . HYPERLIPIDEMIA 09/22/2007  . LEUKOCYTOSIS 09/22/2007  . ATHEROSCLEROTIC CARDIOVASCULAR DISEASE 09/22/2007  . ALLERGIC RHINITIS  09/22/2007  . COPD GOLD II 09/22/2007  . OSTEOARTHRITIS 09/22/2007  . OSTEOPOROSIS 09/22/2007   Past Medical History:  Diagnosis Date  . Allergic rhinitis   . COPD (chronic obstructive pulmonary disease) ([place])   . History of ASCVD (atherosclerotic cardiovascular disease)   . Hyperlipemia   . Leukocytosis   . Osteoarthritis   . Osteoporosis   . Tobacco abuse     Family History  Problem Relation Age of Onset  . Heart disease Father     Past Surgical History:  Procedure Laterality Date  . angiogram     Social History   Occupational History  . Retired Retired   Social History Main Topics  . Smoking status: Current Every Day Smoker    Packs/day: 1.50    Years: 63.00    Types: Cigarettes  . Smokeless tobacco: Never Used     Comment: started smoking at age 6   . Alcohol use No  . Drug use: No  . Sexual activity: Not on file

## 2016-09-14 DIAGNOSIS — I4891 Unspecified atrial fibrillation: Secondary | ICD-10-CM | POA: Diagnosis not present

## 2016-09-14 DIAGNOSIS — I10 Essential (primary) hypertension: Secondary | ICD-10-CM | POA: Diagnosis not present

## 2016-09-14 DIAGNOSIS — J9610 Chronic respiratory failure, unspecified whether with hypoxia or hypercapnia: Secondary | ICD-10-CM | POA: Diagnosis not present

## 2016-09-14 DIAGNOSIS — J449 Chronic obstructive pulmonary disease, unspecified: Secondary | ICD-10-CM | POA: Diagnosis not present

## 2016-09-14 DIAGNOSIS — Z681 Body mass index (BMI) 19 or less, adult: Secondary | ICD-10-CM | POA: Diagnosis not present

## 2016-09-14 DIAGNOSIS — R7301 Impaired fasting glucose: Secondary | ICD-10-CM | POA: Diagnosis not present

## 2017-04-18 IMAGING — CR DG ANKLE COMPLETE 3+V*R*
3 series · 3 of 3 positions shown · non-contrast
Comparison: None.

CLINICAL DATA: Tripped and fell at home. Right ankle pain,
swelling, and bruising. Initial encounter.

EXAM:
RIGHT ANKLE - COMPLETE 3+ VIEW

[x ankle ap right]
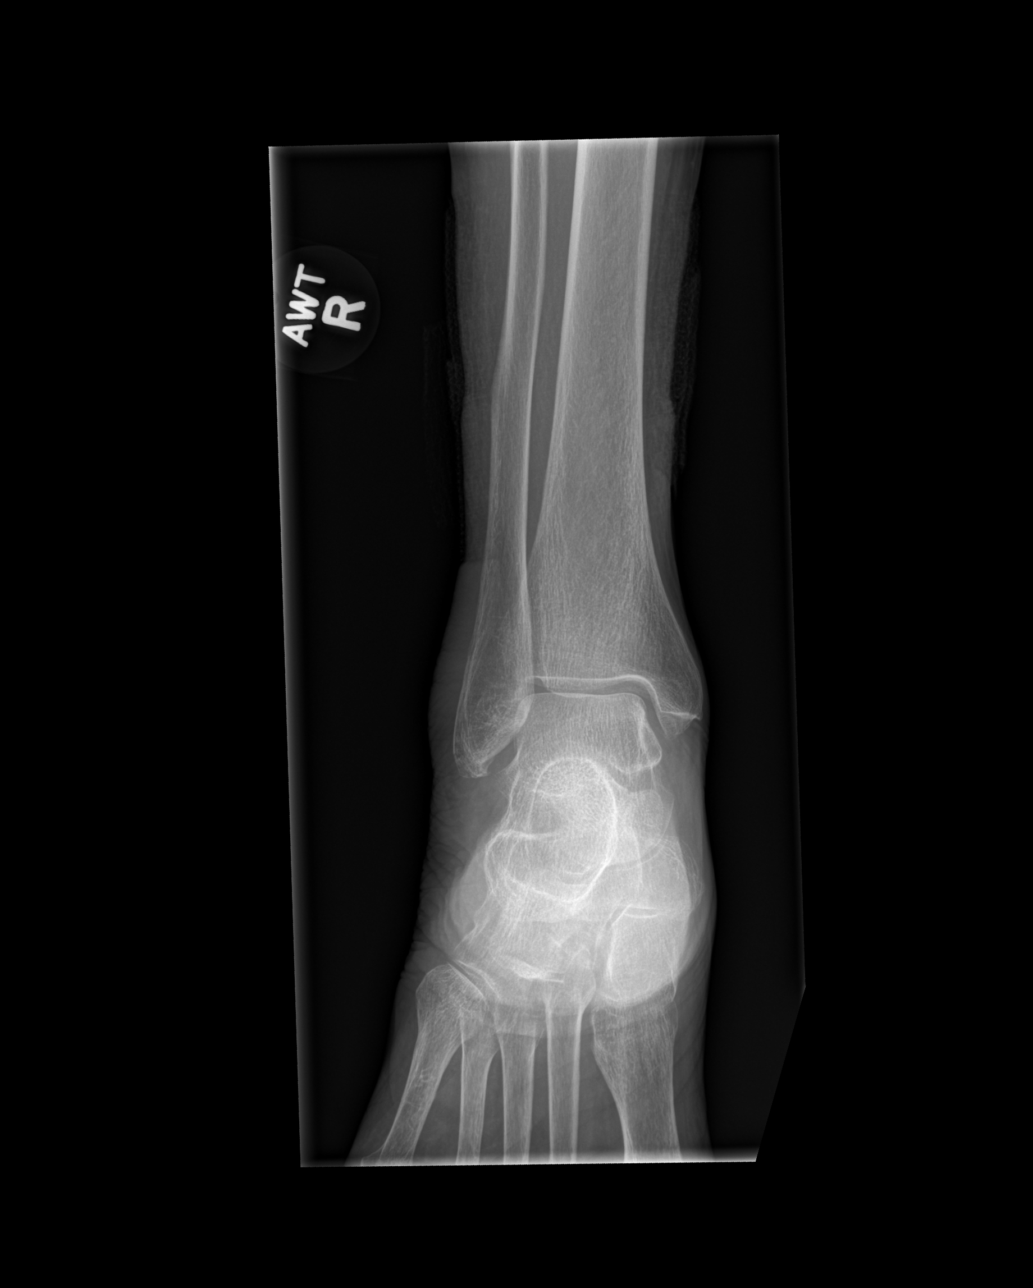

[x ankle obl right]
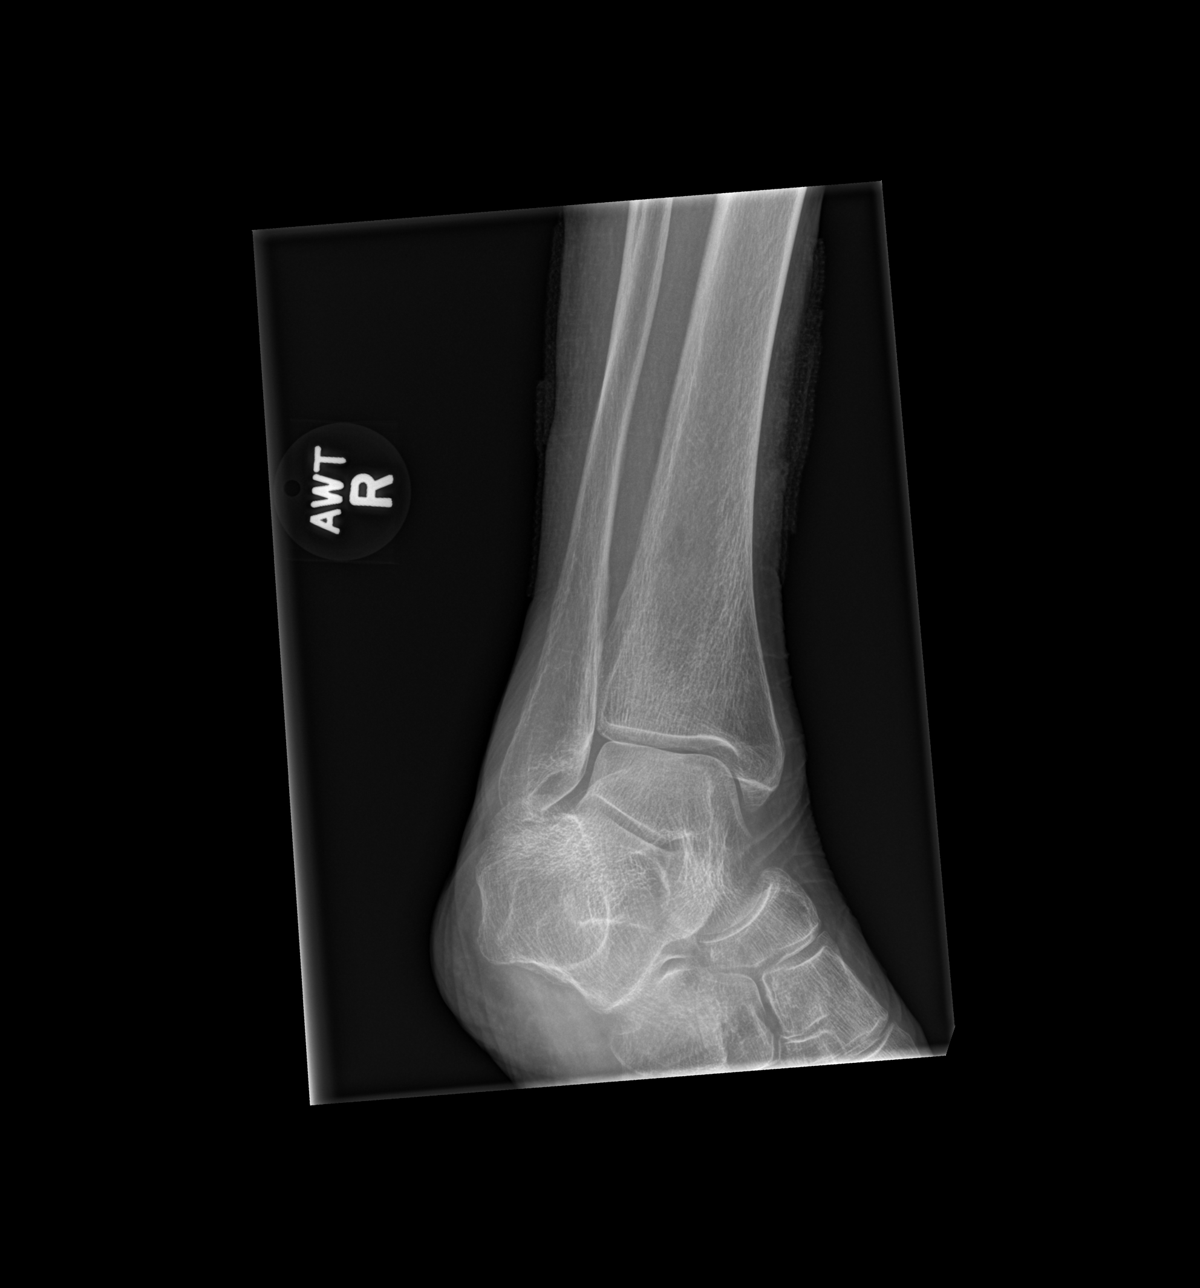

[x ankle lat right]
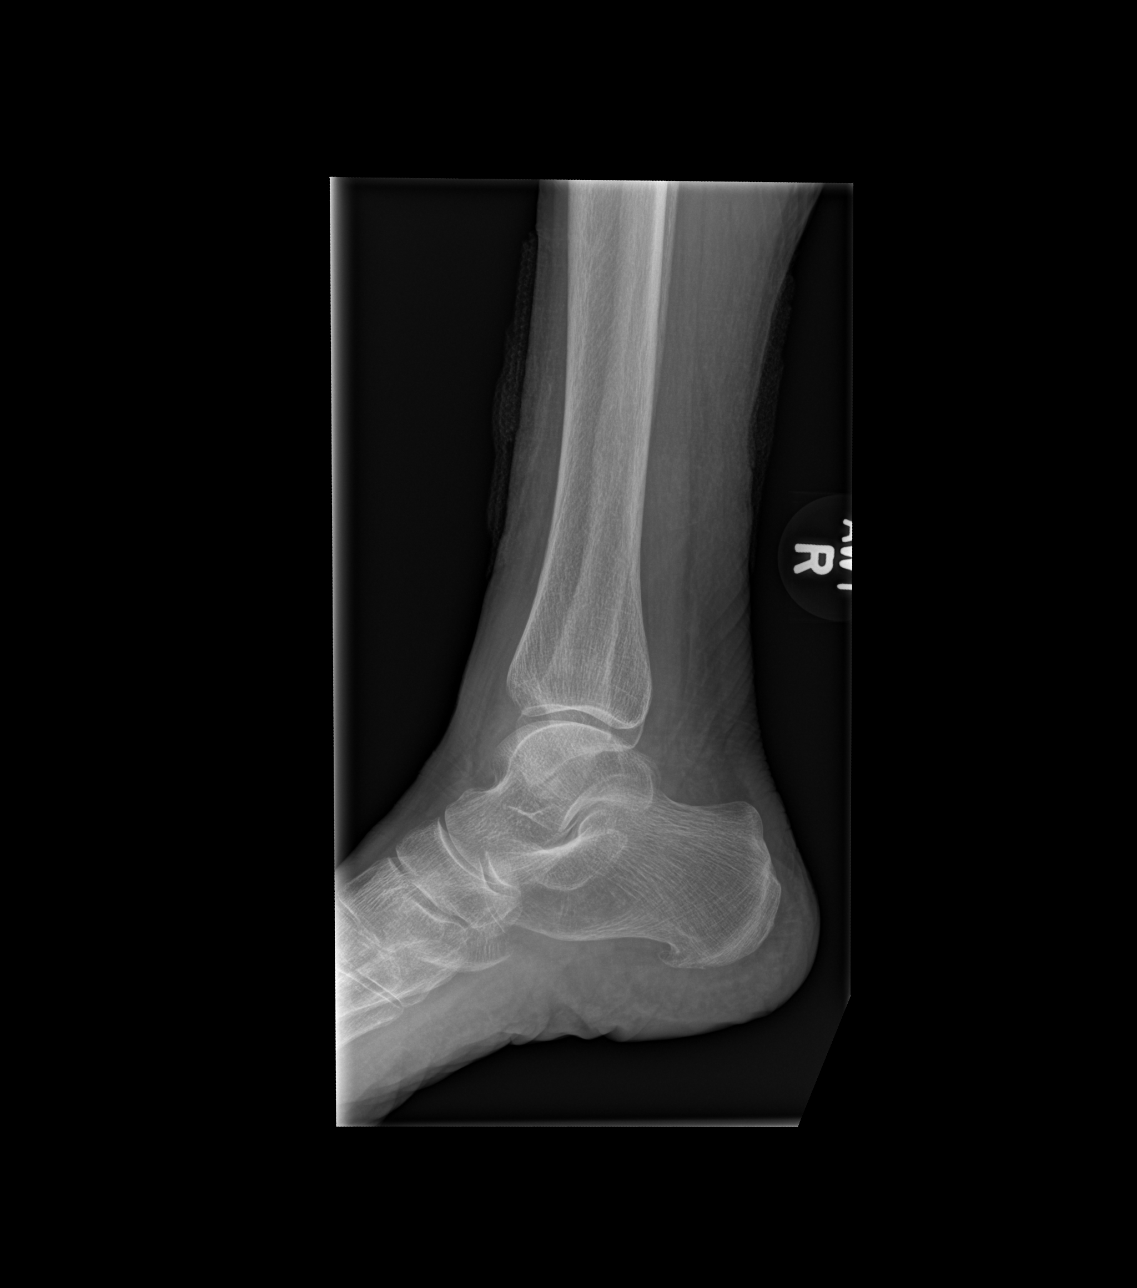

[3 of 3 positions shown; findings below may reference images not displayed]

FINDINGS: There is no evidence of fracture, dislocation, or joint effusion.
There is no evidence of arthropathy or other focal bone abnormality.
Small plantar calcaneal bone spur noted. Generalized osteopenia.
Soft tissues are unremarkable.
IMPRESSION: No acute findings.

## 2017-04-18 DEATH — deceased
